# Patient Record
Sex: Male | Born: 1940
Health system: Southern US, Community
[De-identification: ages and names within clinical notes are randomized; demographics above are authoritative.]

## PROBLEM LIST (undated history)

## (undated) DIAGNOSIS — G4730 Sleep apnea, unspecified: Secondary | ICD-10-CM

## (undated) DIAGNOSIS — R001 Bradycardia, unspecified: Secondary | ICD-10-CM

## (undated) DIAGNOSIS — I10 Essential (primary) hypertension: Secondary | ICD-10-CM

## (undated) DIAGNOSIS — C801 Malignant (primary) neoplasm, unspecified: Secondary | ICD-10-CM

## (undated) DIAGNOSIS — I517 Cardiomegaly: Secondary | ICD-10-CM

## (undated) DIAGNOSIS — I480 Paroxysmal atrial fibrillation: Secondary | ICD-10-CM

## (undated) DIAGNOSIS — I499 Cardiac arrhythmia, unspecified: Secondary | ICD-10-CM

## (undated) DIAGNOSIS — I495 Sick sinus syndrome: Secondary | ICD-10-CM

## (undated) DIAGNOSIS — R55 Syncope and collapse: Secondary | ICD-10-CM

## (undated) HISTORY — PX: COLONOSCOPY: SHX174

## (undated) HISTORY — PX: VASECTOMY: SHX75

## (undated) HISTORY — PX: TONSILLECTOMY: SUR1361

## (undated) HISTORY — DX: Essential (primary) hypertension: I10

## (undated) HISTORY — PX: FOOT SURGERY: SHX648

## (undated) HISTORY — PX: ADENOIDECTOMY: SUR15

## (undated) HISTORY — DX: Sleep apnea, unspecified: G47.30

## (undated) HISTORY — DX: Syncope and collapse: R55

## (undated) HISTORY — DX: Bradycardia, unspecified: R00.1

## (undated) HISTORY — DX: Paroxysmal atrial fibrillation: I48.0

## (undated) HISTORY — PX: COLONOSCOPY W/ POLYPECTOMY: SHX1380

## (undated) HISTORY — DX: Cardiomegaly: I51.7

---
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
## 2000-12-30 ENCOUNTER — Encounter (INDEPENDENT_AMBULATORY_CARE_PROVIDER_SITE_OTHER): Payer: Self-pay | Admitting: *Deleted

## 2000-12-30 ENCOUNTER — Ambulatory Visit (HOSPITAL_COMMUNITY): Admission: RE | Admit: 2000-12-30 | Discharge: 2000-12-30 | Payer: Self-pay | Admitting: *Deleted

## 2003-02-22 ENCOUNTER — Emergency Department (HOSPITAL_COMMUNITY): Admission: EM | Admit: 2003-02-22 | Discharge: 2003-02-22 | Payer: Self-pay | Admitting: Emergency Medicine

## 2004-01-18 ENCOUNTER — Ambulatory Visit (HOSPITAL_COMMUNITY): Admission: RE | Admit: 2004-01-18 | Discharge: 2004-01-18 | Payer: Self-pay | Admitting: *Deleted

## 2004-01-18 ENCOUNTER — Encounter (INDEPENDENT_AMBULATORY_CARE_PROVIDER_SITE_OTHER): Payer: Self-pay | Admitting: Specialist

## 2006-03-24 ENCOUNTER — Ambulatory Visit (HOSPITAL_COMMUNITY): Admission: RE | Admit: 2006-03-24 | Discharge: 2006-03-24 | Payer: Self-pay | Admitting: *Deleted

## 2006-03-24 ENCOUNTER — Encounter (INDEPENDENT_AMBULATORY_CARE_PROVIDER_SITE_OTHER): Payer: Self-pay | Admitting: *Deleted

## 2006-11-12 ENCOUNTER — Encounter: Admission: RE | Admit: 2006-11-12 | Discharge: 2006-11-12 | Payer: Self-pay | Admitting: Internal Medicine

## 2007-11-15 ENCOUNTER — Emergency Department (HOSPITAL_COMMUNITY): Admission: EM | Admit: 2007-11-15 | Discharge: 2007-11-15 | Payer: Self-pay | Admitting: Emergency Medicine

## 2008-04-06 ENCOUNTER — Ambulatory Visit (HOSPITAL_COMMUNITY): Admission: RE | Admit: 2008-04-06 | Discharge: 2008-04-06 | Payer: Self-pay | Admitting: *Deleted

## 2008-04-06 ENCOUNTER — Encounter (INDEPENDENT_AMBULATORY_CARE_PROVIDER_SITE_OTHER): Payer: Self-pay | Admitting: *Deleted

## 2008-09-28 IMAGING — CT CT CHEST LIMITED W/O CM
2 of 3 series · 15 of 28 positions shown, 18 images · IV contrast (agent unspecified)
Comparison: none

CLINICAL DATA: Swelling of left sternoclavicular joint for a year.  No known trauma.
 CHEST CT WITHOUT CONTRAST:
TECHNIQUE: Multidetector CT imaging of the chest was performed following the standard protocol without IV contrast.

[Series 300: coronal · coronal · 0.76mm/px · 4 of 40 slices shown]
[im 8/40  lung]
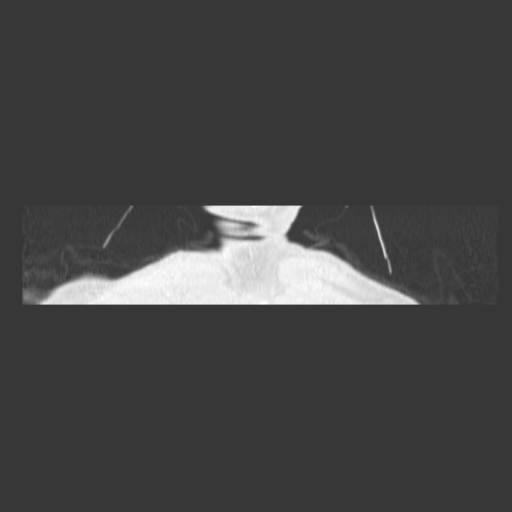
[im 16/40  lung]
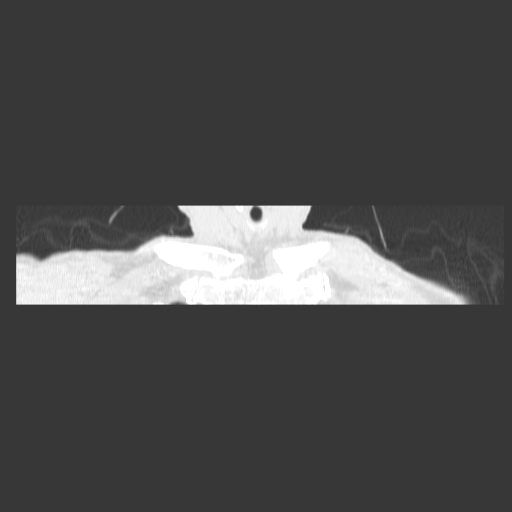
[im 24/40  lung]
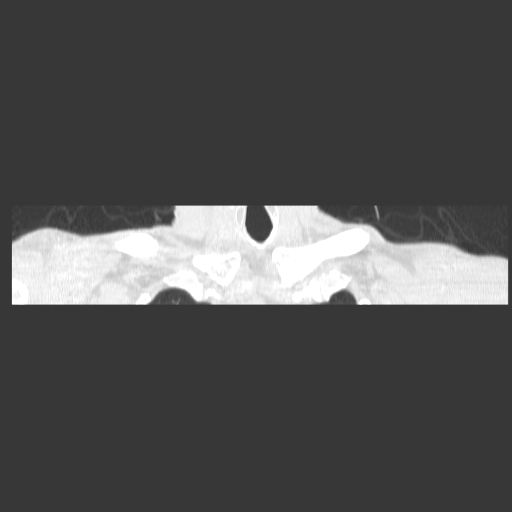
[im 32/40  lung]
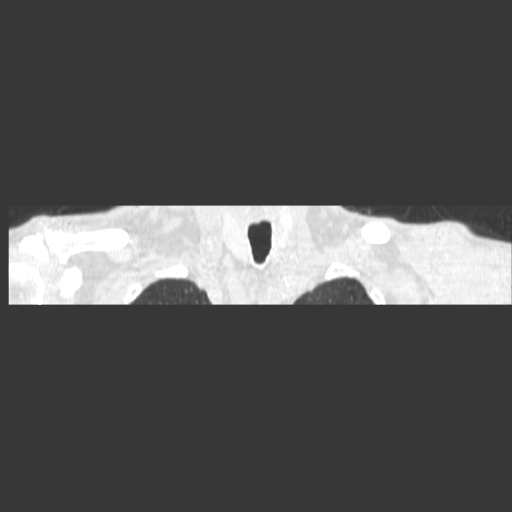

[Series 301: sagittal · sagittal · 0.76mm/px · 11 of 94 slices shown, 14 images]
[im 8/94  mediastinal]
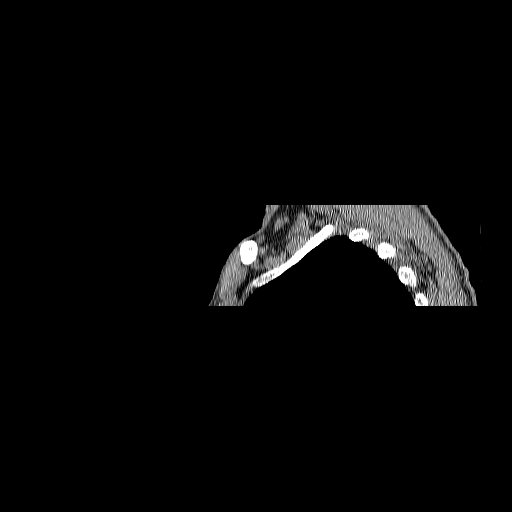
[im 8/94  lung]
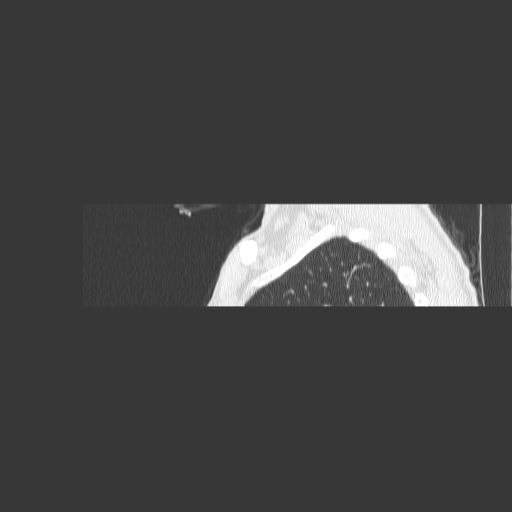
[im 16/94  lung]
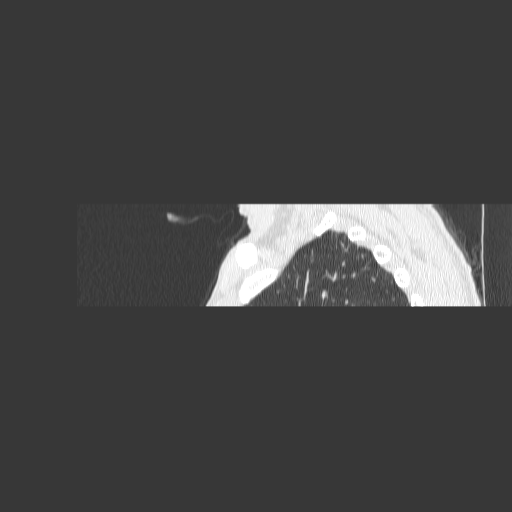
[im 24/94  lung]
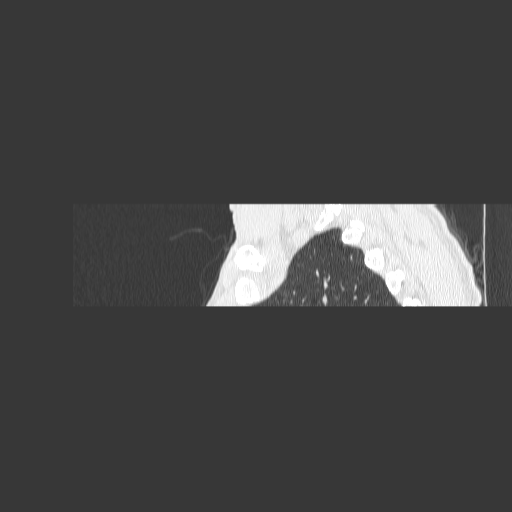
[im 32/94  lung]
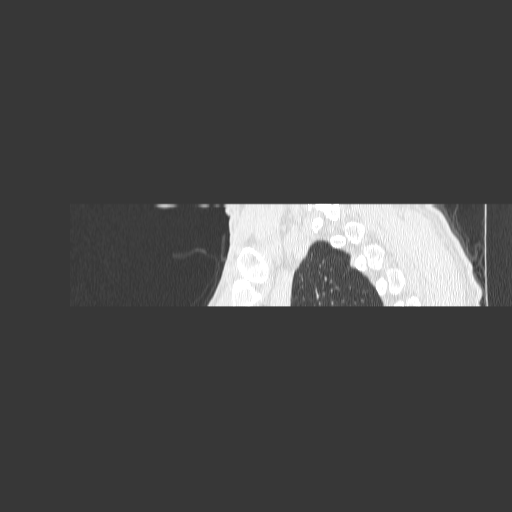
[im 39/94  mediastinal]
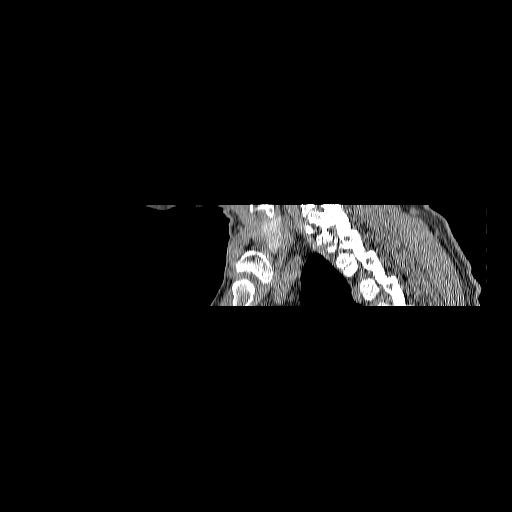
[im 39/94  lung]
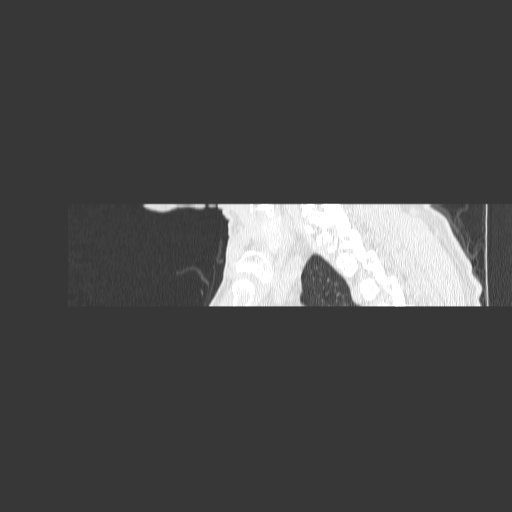
[im 47/94  lung]
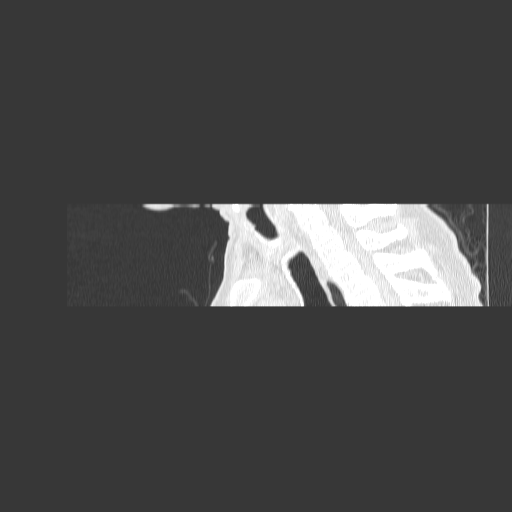
[im 55/94  lung]
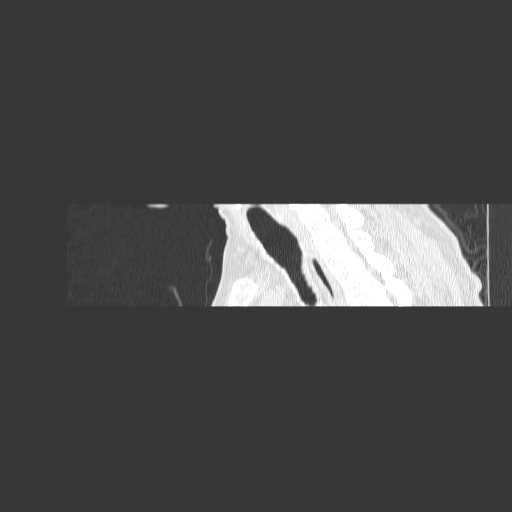
[im 63/94  lung]
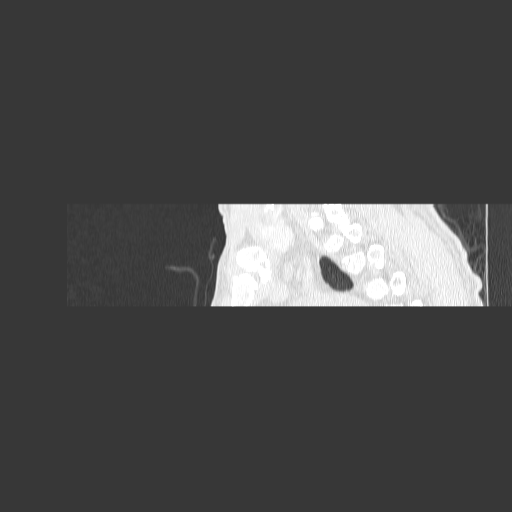
[im 70/94  mediastinal]
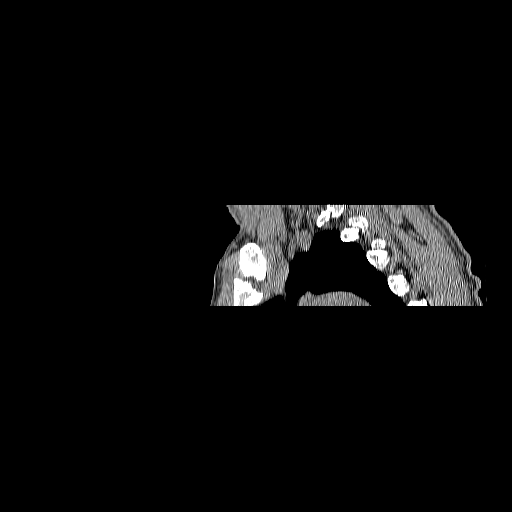
[im 70/94  lung]
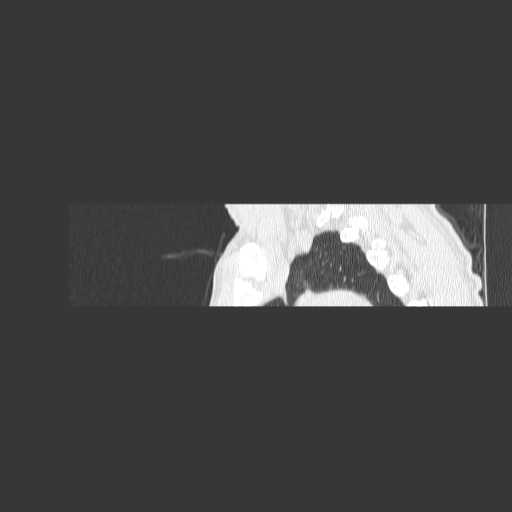
[im 78/94  lung]
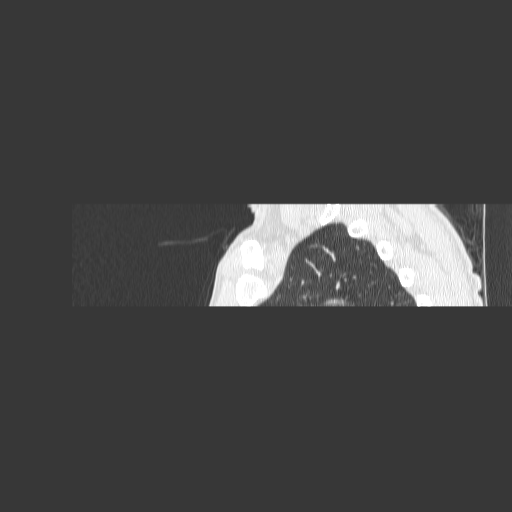
[im 86/94  lung]
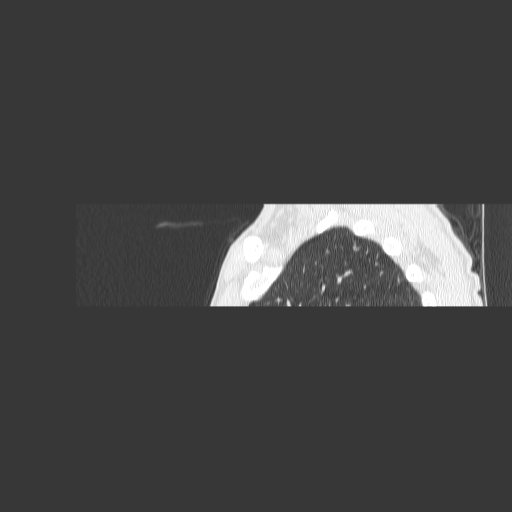

[15 of 28 positions shown; findings below may reference images not displayed]

FINDINGS: There is ordinary appearance osteoarthritic change of the left sternoclavicular joint with sclerosis, cystic change and hypertrophic change.  There is no sign of a destructive process to suggest infection or neoplasm.  There are no specific findings to suggest any other type of arthropathy.  Mild degenerative changes are also evident at the acromioclavicular joints.   The lung apices are clear.  The upper mediastinum is unremarkable.   The soft tissues of the lower neck are unremarkable except for a 5 mm low density area in the left lobe of the thyroid likely to represent an insignificant cyst.
IMPRESSION: Hypertrophic osteoarthritic change of the left sternoclavicular joint.  See above for full discussion.

## 2009-10-01 IMAGING — CR DG CHEST 2V
2 series · 2 of 2 positions shown · non-contrast
Comparison: None

CLINICAL DATA: Shortness of breath.  Chest pain.

CHEST - 2 VIEW

[w chest pa]
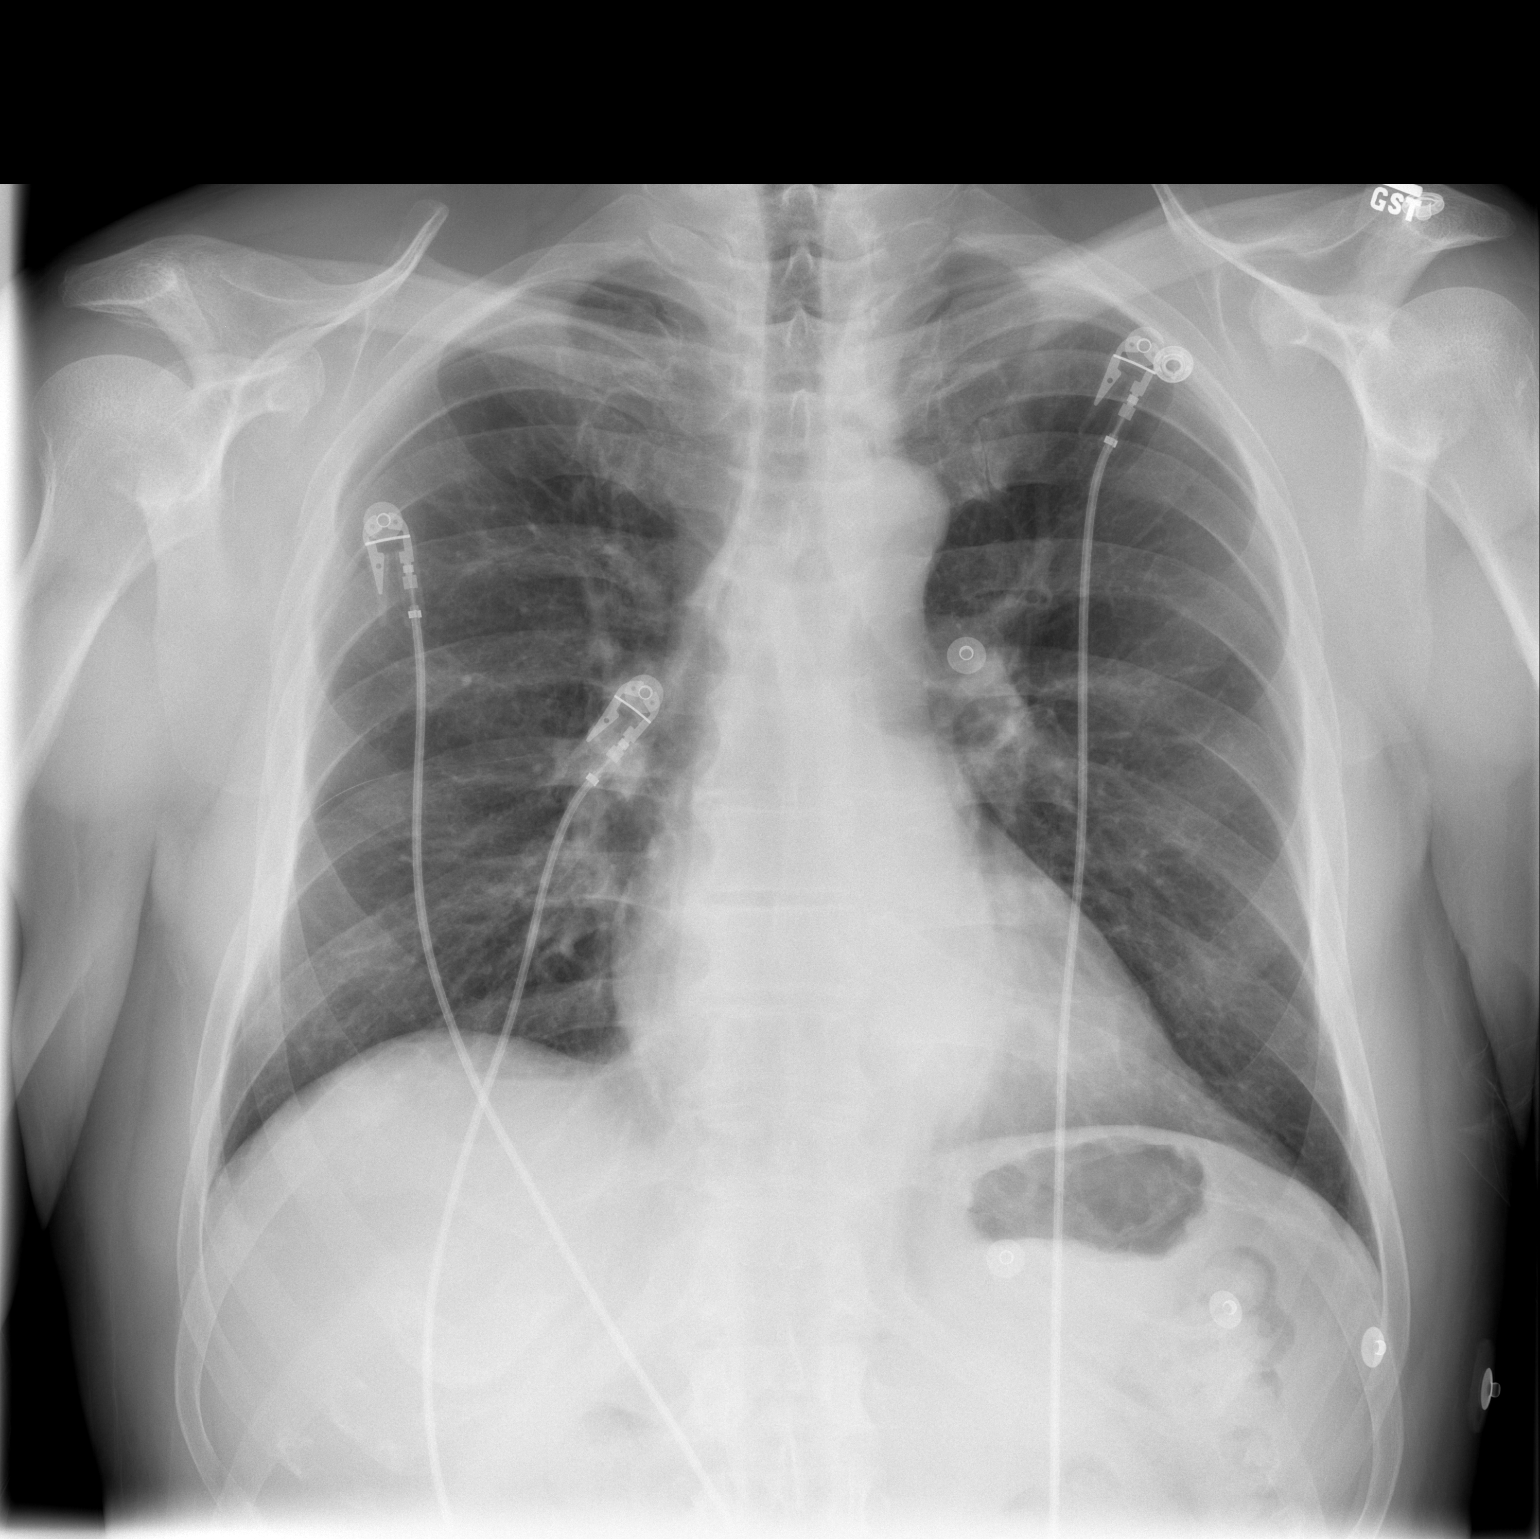

[w chest lat]
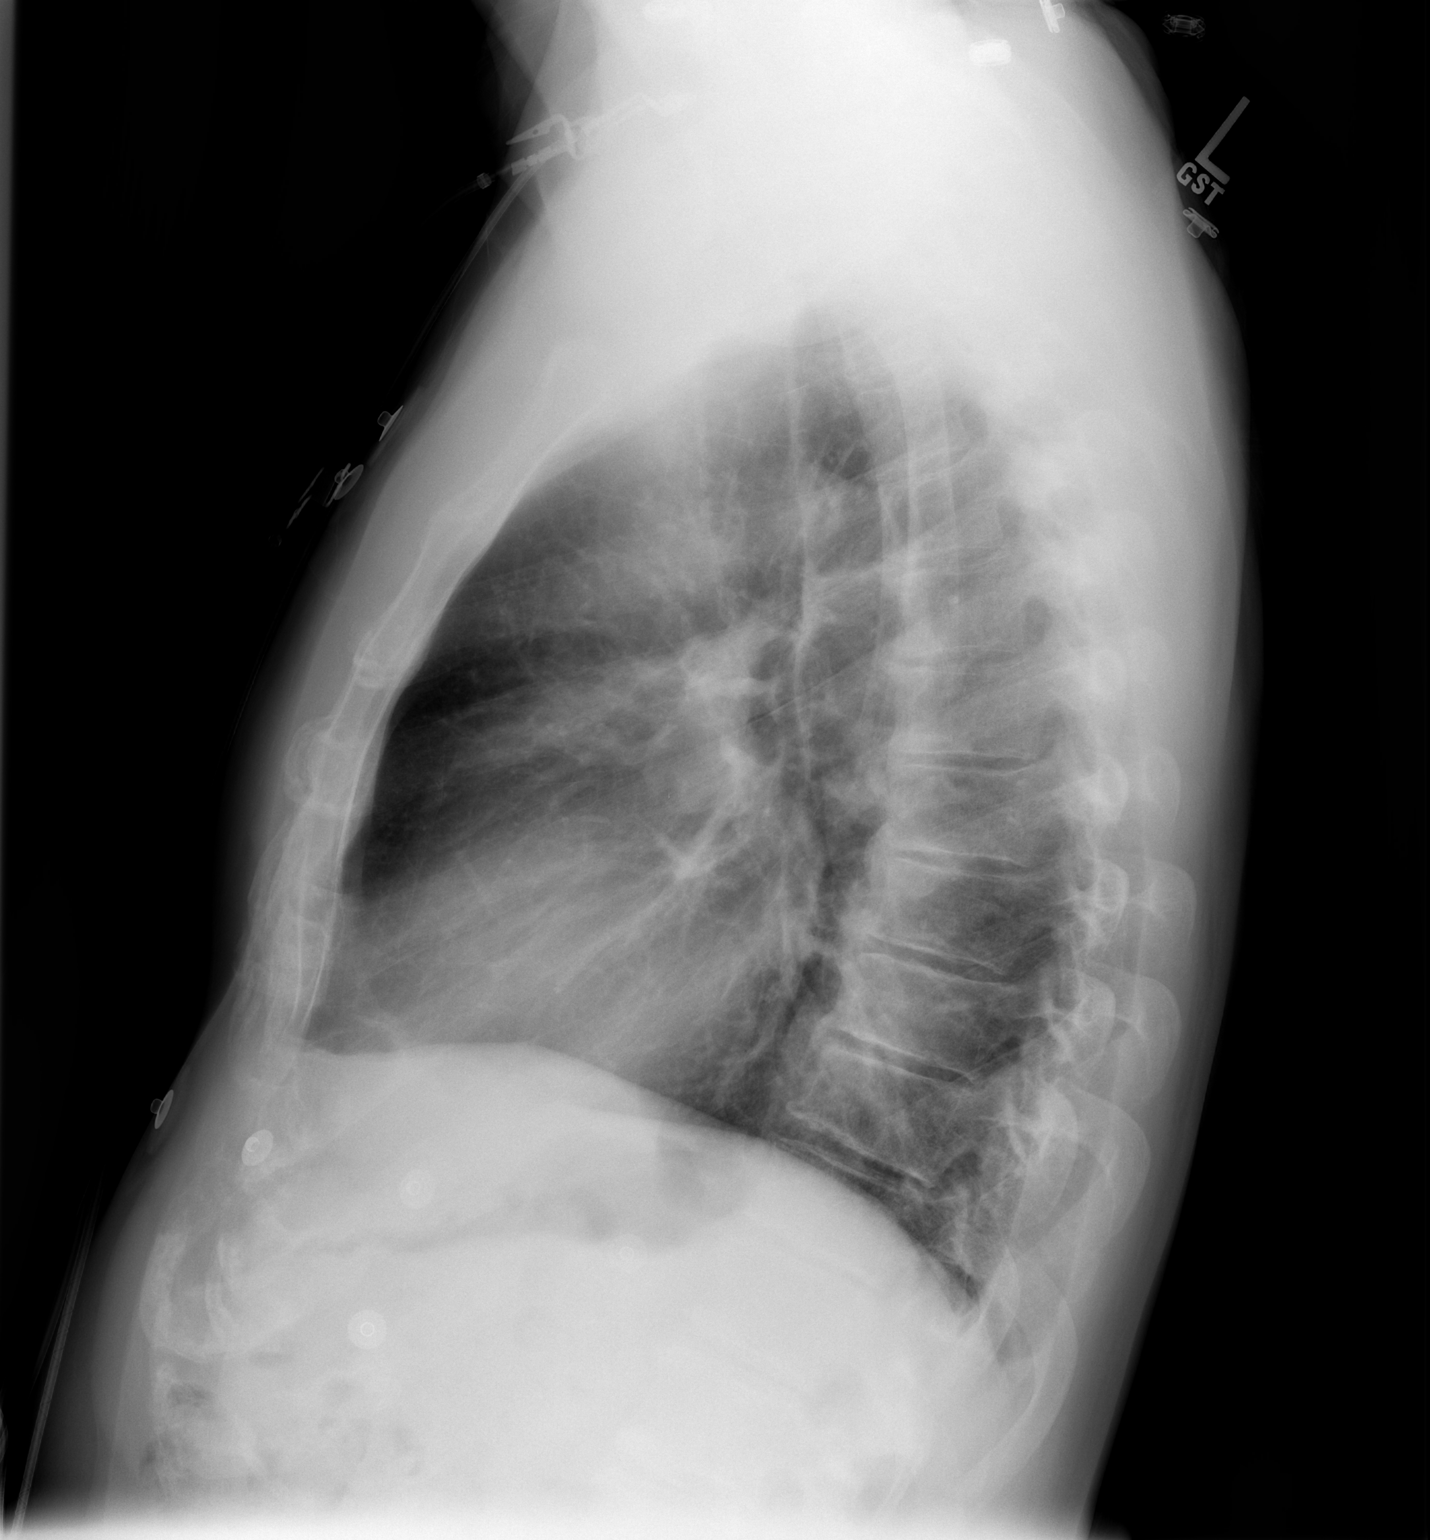

[2 of 2 positions shown; findings below may reference images not displayed]

FINDINGS: Mid thoracic spondylosis. Midline trachea. Normal heart
size and mediastinal contours. No pleural effusion or pneumothorax.
Clear lungs.
IMPRESSION: No acute cardiopulmonary disease.

## 2010-06-18 NOTE — Op Note (Signed)
NAMEPRINCE, COUEY NO.:  [id number]   MEDICAL RECORD NO.:  [id number]          PATIENT TYPE:  AMB   LOCATION:  ENDO                         FACILITY:  [facility]   PHYSICIAN:  Georgiana Spinner, M.D.    DATE OF BIRTH:  10-08-1940   DATE OF PROCEDURE:  04/06/2008  DATE OF DISCHARGE:                               OPERATIVE REPORT   PROCEDURE:  Colonoscopy.   INDICATIONS:  Colon polyps.   ANESTHESIA:  1. Fentanyl 100 mcg.  2. Versed 10 mg.   PROCEDURE:  With the patient mildly sedated in the left lateral  decubitus position, a rectal exam was performed which was unremarkable  to my exam.  Subsequently, the Pentax videoscopic pediatric colonoscope  was inserted in the rectum and passed under direct vision with pressure  applied to reach the cecum identified by base of cecum, ileocecal valve,  both of which were photographed.  From this point, the colonoscope was  slowly withdrawn, taking circumferential views of the colonic mucosa,  stopping in the hepatic flexure where a polyp was seen, photographed and  removed using hot biopsy forceps technique setting of 20/150 blended  current.  Tissue was retrieved for pathology.  The endoscope was then  withdrawn all the way to the rectum, stopping to photograph diverticula  seen along the way in the sigmoid colon.  The rectum appeared normal on  direct and showed hemorrhoids on retroflexed view.  The endoscope was  straightened and withdrawn.  The patient's vital signs, pulse oximeter  remained stable.  The patient tolerated procedure well without apparent  complication.   FINDINGS:  Polyp of hepatic flexure removed, diverticulosis moderately  severe with thickening of the sigmoid colon area of fair significance  and internal hemorrhoids.   PLAN:  Await biopsy report.  The patient will call me for results and  follow up with me as an outpatient.           ______________________________  Georgiana Spinner, M.D.     GMO/MEDQ  D:  04/06/2008  T:  04/06/2008  Job:  [id number]

## 2010-06-21 NOTE — Op Note (Signed)
[name], Matthew Cannon NO.:  [id number]   MEDICAL RECORD NO.:  [id number]          PATIENT TYPE:  AMB   LOCATION:  ENDO                         FACILITY:  [facility]   PHYSICIAN:  Georgiana Spinner, M.D.    DATE OF BIRTH:  February 10, 1940   DATE OF PROCEDURE:  01/18/2004  DATE OF DISCHARGE:                                 OPERATIVE REPORT   PROCEDURE:  Colonoscopy with biopsy and polypectomy.   INDICATIONS FOR PROCEDURE:  Colon polyp.   ANESTHESIA:  Demerol 60, Versed 7 mg.   DESCRIPTION OF PROCEDURE:  With the patient mildly sedated in the left  lateral decubitus position, the Olympus videoscopic colonoscope was inserted  in the rectum after a normal rectal exam and passed under direct vision to  the cecum identified by the ileocecal valve and appendiceal orifice.  From  this point, the colonoscope was slowly withdrawn taking circumferential  views of the colonic mucosa stopping only to photograph diverticula seen  along the way as we reached the descending colon where a small polyp was  seen and removed using hot biopsy forceps technique on a setting of 20:20  blended current.  We next stopped in the sigmoid colon where a second polyp  was seen and it too was removed.  This time using snare cautery technique  again the same setting of 20/20 blended current,  we withdrew the  colonoscope all the way to the rectum which appeared normal in direct and  retroflexed view.  The endoscope was straightened and withdrawn. The  patient's vital signs and pulse oximeter remained stable. The patient  tolerated the procedure well without apparent complications.   FINDINGS:  Diverticulosis of the sigmoid colon and somewhat of descending  colon as well.  Polyps of descending colon near the splenic flexure and of  the sigmoid colon in the rectosigmoid area.   PLAN:  Await biopsy report. The patient will call me for results and  followup with me as an outpatient.      GMO/MEDQ  D:   01/18/2004  T:  01/18/2004  Job:  [id number]

## 2010-06-21 NOTE — Op Note (Signed)
NAMEJACINTO, Matthew Cannon NO.:  [id number]   MEDICAL RECORD NO.:  [id number]          PATIENT TYPE:  AMB   LOCATION:  ENDO                         FACILITY:  [facility]   PHYSICIAN:  Georgiana Spinner, M.D.    DATE OF BIRTH:  1940-12-08   DATE OF PROCEDURE:  03/24/2006  DATE OF DISCHARGE:                               OPERATIVE REPORT   PROCEDURE:  Colonoscopy.   INDICATIONS:  Colon polyps.   ANESTHESIA:  Fentanyl 125 mcg, Versed 12 mg.   PROCEDURE:  With the patient mildly sedated in the left lateral  decubitus position, a rectal examination was performed and was normal to  my limited exam.  Subsequently the Pentax videoscopic colonoscope was  inserted in the rectum and passed under direct vision to the sigmoid  colon and I could pass it no further due to tortuosity so I removed this  colonoscope and subsequently placed a Pentax videoscopic pediatric  colonoscope into the rectum and passed it under direct vision through a  very tortuous diverticula filled sigmoid colon.  With pressure applied,  we were able to reach the cecum and cecum identified by ileocecal valve  and appendiceal orifice, both which were photographed. From this point  the colonoscope was slowly withdrawn taking circumferential views of  colonic mucosa stopping in the ascending colon where a polyp was seen  and removed using hot biopsy forceps technique, setting of 20/200  blended current.  We next stopped only in the rectum which appeared  normal on direct and showed hemorrhoids on retroflexed view.  The  endoscope was straightened and withdrawn.  The patient's vital signs,  pulse oximeter remained stable.  The patient tolerated procedure well  without apparent complications.   FINDINGS:  Diverticular filled sigmoid colon.  Internal hemorrhoids,  polyp of ascending colon.  Await biopsy report.  The patient will call  me for results and follow-up with me as an outpatient.     ______________________________  Georgiana Spinner, M.D.     GMO/MEDQ  D:  03/24/2006  T:  03/24/2006  Job:  [id number]

## 2010-06-21 NOTE — Procedures (Signed)
[facility]  Patient:    Matthew Cannon, Matthew Cannon Visit Number: [id number] MRN: [id number]          Service Type: END Location: ENDO Attending Physician:  Sabino Gasser Dictated by:   Sabino Gasser, M.D. Proc. Date: 12/30/00 Admit Date:  12/30/2000                             Procedure Report  PROCEDURE:  Colonoscopy.  INDICATION FOR PROCEDURE:  Rectal bleeding.  ANESTHESIA:  Demerol 100, Versed 10 mg.  DESCRIPTION OF PROCEDURE:  With the patient mildly sedated in the left lateral decubitus position, the Olympus videoscopic pediatric colonoscope was inserted in the rectum and passed under direct vision to the cecum identified by the ileocecal valve and appendiceal orifice both of which were photographed. From this point, the colonoscope was slowly withdrawn taking circumferential views of the entire colonic mucosa, stopping at 40 cm from the anal verge at which a small polyp was seen, photographed, and removed using hot biopsy forceps technique on a setting of 20:20 blended current. There was moderate diverticulosis seen in the sigmoid colon as well until we reached the rectum which appeared normal in direct and retroflexed view. The endoscope was straightened and withdrawn. The patients vital signs and pulse oximeter remained stable. The patient tolerated the procedure well without apparent complications.  FINDINGS:  Diverticulosis of the sigmoid colon, polyp 40 cm removed. Await biopsy report. The patient will call me for results and follow-up with me as an outpatient. Dictated by:   Sabino Gasser, M.D. Attending Physician:  Sabino Gasser DD:  12/30/00 TD:  12/30/00 Job: [id number]

## 2010-11-04 LAB — BASIC METABOLIC PANEL
BUN: 18
CO2: 27
Chloride: 106
Creatinine, Ser: 0.99
Glucose, Bld: 105 — ABNORMAL HIGH

## 2010-11-04 LAB — D-DIMER, QUANTITATIVE: D-Dimer, Quant: 0.66 — ABNORMAL HIGH

## 2012-06-11 ENCOUNTER — Emergency Department (HOSPITAL_COMMUNITY)
Admission: EM | Admit: 2012-06-11 | Discharge: 2012-06-11 | Disposition: A | Discharge status: Home / Self Care | Payer: Medicare Other | Attending: Emergency Medicine | Admitting: Emergency Medicine

## 2012-06-11 ENCOUNTER — Encounter (HOSPITAL_COMMUNITY): Payer: Self-pay | Admitting: Emergency Medicine

## 2012-06-11 DIAGNOSIS — T148XXA Other injury of unspecified body region, initial encounter: Secondary | ICD-10-CM

## 2012-06-11 DIAGNOSIS — Y929 Unspecified place or not applicable: Secondary | ICD-10-CM | POA: Insufficient documentation

## 2012-06-11 DIAGNOSIS — Z23 Encounter for immunization: Secondary | ICD-10-CM | POA: Insufficient documentation

## 2012-06-11 DIAGNOSIS — Y9389 Activity, other specified: Secondary | ICD-10-CM | POA: Insufficient documentation

## 2012-06-11 DIAGNOSIS — S60459A Superficial foreign body of unspecified finger, initial encounter: Secondary | ICD-10-CM | POA: Insufficient documentation

## 2012-06-11 DIAGNOSIS — W268XXA Contact with other sharp object(s), not elsewhere classified, initial encounter: Secondary | ICD-10-CM | POA: Insufficient documentation

## 2012-06-11 DIAGNOSIS — Z79899 Other long term (current) drug therapy: Secondary | ICD-10-CM | POA: Insufficient documentation

## 2012-06-11 MED ORDER — TETANUS-DIPHTH-ACELL PERTUSSIS 5-2.5-18.5 LF-MCG/0.5 IM SUSP
0.5000 mL | Freq: Once | INTRAMUSCULAR | Status: AC
  Administered 2012-06-11: 0.5 mL via INTRAMUSCULAR
  Filled 2012-06-11: qty 0.5

## 2012-06-11 MED ORDER — CEPHALEXIN 500 MG PO CAPS: 500.0000 mg | ORAL_CAPSULE | Freq: Four times a day (QID) | ORAL | Status: DC

## 2012-06-11 NOTE — ED Notes (Signed)
Discharge instructions reviewed. Pt verbalized understanding.  

## 2012-06-11 NOTE — ED Provider Notes (Signed)
History     CSN: [id number]  Arrival date & time 06/11/12  2440   First MD Initiated Contact with Patient 06/11/12 1017      Chief Complaint  Patient presents with  . Foreign Body in Skin    (Consider location/radiation/quality/duration/timing/severity/associated sxs/prior treatment) HPI Comments: Patient is a 72 year old male who presents today after sawing a piece of wood and getting a splinter in his left thumb. He states he knows it is a large piece of wood that goes all the way from the distal fingernail through the skin on the palmar side and comes out approximately 3 cm proximally. He states he is in mild pain. The pain does not radiate. No fever, chills, nausea, vomiting, abdominal pain, numbness, weakness. He does not know the date of his last tetanus shot.   The history is provided by the patient. No language interpreter was used.    History reviewed. No pertinent past medical history.  History reviewed. No pertinent past surgical history.  History reviewed. No pertinent family history.  History  Substance Use Topics  . Smoking status: Never Smoker   . Smokeless tobacco: Not on file  . Alcohol Use: Yes      Review of Systems  Constitutional: Negative for fever and chills.  Respiratory: Negative for shortness of breath.   Gastrointestinal: Negative for nausea and vomiting.  Skin: Positive for wound. Negative for rash.  All other systems reviewed and are negative.    Allergies  Zithromax  Home Medications   Current Outpatient Rx  Name  Route  Sig  Dispense  Refill  . lisinopril (PRINIVIL,ZESTRIL) 10 MG tablet   Oral   Take 10 mg by mouth every evening.           BP 153/88  Pulse 54  Temp(Src) 97.5 F (36.4 C) (Oral)  Resp 18  SpO2 95%  Physical Exam  Nursing note and vitals reviewed. Constitutional: He is oriented to person, place, and time. He appears well-developed and well-nourished. No distress.  HENT:  Head: Normocephalic and  atraumatic.  Right Ear: External ear normal.  Left Ear: External ear normal.  Nose: Nose normal.  Eyes: Conjunctivae are normal.  Neck: Normal range of motion. No tracheal deviation present.  Cardiovascular: Normal rate, regular rhythm and normal heart sounds.   Pulmonary/Chest: Effort normal and breath sounds normal. No stridor.  Abdominal: Soft. He exhibits no distension. There is no tenderness.  Musculoskeletal: Normal range of motion.  Neurological: He is alert and oriented to person, place, and time.  Skin: Skin is warm and dry. He is not diaphoretic.  Wood splinter visible on palmar aspect of left thumb, with splinter visible on distal end of the fingernail on left thumb No erythema, swelling Some dried blood Flexion and extension intact in left thumb; neurovascularly intact  Psychiatric: He has a normal mood and affect. His behavior is normal.    ED Course  FOREIGN BODY REMOVAL Date/Time: 06/11/2012 11:17 AM Performed by: Mora Bellman Authorized by: Mora Bellman Consent: Verbal consent obtained. written consent not obtained. The procedure was performed in an emergent situation. Risks and benefits: risks, benefits and alternatives were discussed Consent given by: patient Patient understanding: patient states understanding of the procedure being performed Required items: required blood products, implants, devices, and special equipment available Patient identity confirmed: verbally with patient and arm band Time out: Immediately prior to procedure a "time out" was called to verify the correct patient, procedure, equipment, support staff and site/side marked  as required. Body area: skin General location: upper extremity Location details: left thumb Anesthesia: digital block Local anesthetic: lidocaine 2% without epinephrine Anesthetic total: 3 ml Patient sedated: no Patient restrained: no Patient cooperative: yes Localization method: visualized Removal mechanism:  hemostat Dressing: dressing applied Tendon involvement: none Depth: subcutaneous Complexity: simple 2 objects recovered. Objects recovered: wood splinters Post-procedure assessment: foreign body removed Patient tolerance: Patient tolerated the procedure well with no immediate complications.   (including critical care time)  Labs Reviewed - No data to display No results found.   1. Splinter       MDM  Patient presents today with a large splinter in his left thumb. The splinter was successfully removed after a digital block. He was given prophylactic antibiotics. No signs of infection. No signs of tendon or bone involvement. TDaP given. Return instruction given. Vital signs stable for discharge.Patient / Family / Caregiver informed of clinical course, understand medical decision-making process, and agree with plan.         Mora Bellman, PA-C 06/11/12 1625

## 2012-06-11 NOTE — ED Provider Notes (Signed)
Medical screening examination/treatment/procedure(s) were performed by non-physician practitioner and as supervising physician I was immediately available for consultation/collaboration.  Martha K Linker, MD 06/11/12 1634 

## 2012-06-11 NOTE — ED Notes (Signed)
Pt here with splinter in left thumb after sawing wood; bleeding controlled; small piece of wood sticking out of skin

## 2012-09-30 ENCOUNTER — Ambulatory Visit (INDEPENDENT_AMBULATORY_CARE_PROVIDER_SITE_OTHER): Payer: Self-pay | Admitting: General Surgery

## 2012-09-30 ENCOUNTER — Encounter (INDEPENDENT_AMBULATORY_CARE_PROVIDER_SITE_OTHER): Payer: Self-pay | Admitting: General Surgery

## 2012-10-06 ENCOUNTER — Ambulatory Visit (INDEPENDENT_AMBULATORY_CARE_PROVIDER_SITE_OTHER): Payer: Self-pay | Admitting: General Surgery

## 2012-10-07 ENCOUNTER — Encounter (INDEPENDENT_AMBULATORY_CARE_PROVIDER_SITE_OTHER): Payer: Self-pay | Admitting: General Surgery

## 2012-10-07 ENCOUNTER — Ambulatory Visit (INDEPENDENT_AMBULATORY_CARE_PROVIDER_SITE_OTHER): Payer: Medicare Other | Admitting: General Surgery

## 2012-10-07 VITALS — BP 124/68 | HR 68 | Temp 98.4°F | Resp 14 | Ht 70.0 in | Wt 191.2 lb

## 2012-10-07 DIAGNOSIS — K4090 Unilateral inguinal hernia, without obstruction or gangrene, not specified as recurrent: Secondary | ICD-10-CM

## 2012-10-07 MED ORDER — TAMSULOSIN HCL 0.4 MG PO CAPS: 0.4000 mg | ORAL_CAPSULE | Freq: Every day | ORAL | Status: DC

## 2012-10-07 NOTE — Patient Instructions (Signed)
Start taking your Flomax 3 days before surgery to help prevent bladder from going to sleep after surgery  Hernia Repair with Laparoscope A hernia occurs when an internal organ pushes out through a weak spot in the belly (abdominal) wall muscles. Hernias most commonly occur in the groin and around the navel. Hernias can also occur through a cut by the surgeon (incision) after an abdominal operation. A hernia may be caused by:  Lifting heavy objects.  Prolonged coughing.  Straining to move your bowels. Hernias can often be pushed back into place (reduced). Most hernias tend to get worse over time. Problems occur when abdominal contents get stuck in the opening and the blood supply is blocked or impaired (incarcerated hernia). Because of these risks, you require surgery to repair the hernia. Your hernia will be repaired using a laparoscope. Laparoscopic surgery is a type of minimally invasive surgery. It does not involve making a typical surgical cut (incision) in the skin. A laparoscope is a telescope-like rod and lens system. It is usually connected to a video camera and a light source so your caregiver can clearly see the operative area. The instruments are inserted through  to  inch (5 mm or 10 mm) openings in the skin at specific locations. A working and viewing space is created by blowing a small amount of carbon dioxide gas into the abdominal cavity. The abdomen is essentially blown up like a balloon (insufflated). This elevates the abdominal wall above the internal organs like a dome. The carbon dioxide gas is common to the human body and can be absorbed by tissue and removed by the respiratory system. Once the repair is completed, the small incisions will be closed with either stitches (sutures) or staples (just like a paper stapler only this staple holds the skin together). LET YOUR CAREGIVERS KNOW ABOUT:  Allergies.  Medications taken including herbs, eye drops, over the counter  medications, and creams.  Use of steroids (by mouth or creams).  Previous problems with anesthetics or Novocaine.  Possibility of pregnancy, if this applies.  History of blood clots (thrombophlebitis).  History of bleeding or blood problems.  Previous surgery.  Other health problems. BEFORE THE PROCEDURE  Laparoscopy can be done either in a hospital or out-patient clinic. You may be given a mild sedative to help you relax before the procedure. Once in the operating room, you will be given a general anesthesia to make you sleep (unless you and your caregiver choose a different anesthetic).  AFTER THE PROCEDURE  After the procedure you will be watched in a recovery area. Depending on what type of hernia was repaired, you might be admitted to the hospital or you might go home the same day. With this procedure you may have less pain and scarring. This usually results in a quicker recovery and less risk of infection. HOME CARE INSTRUCTIONS   Bed rest is not required. You may continue your normal activities but avoid heavy lifting (more than 10 pounds) or straining.  Cough gently. If you are a smoker it is best to stop, as even the best hernia repair can break down with the continual strain of coughing.  Avoid driving until given the OK by your surgeon.  There are no dietary restrictions unless given otherwise.  TAKE ALL MEDICATIONS AS DIRECTED.  Only take over-the-counter or prescription medicines for pain, discomfort, or fever as directed by your caregiver. SEEK MEDICAL CARE IF:   There is increasing abdominal pain or pain in your incisions.  There is more bleeding from incisions, other than minimal spotting.  You feel light headed or faint.  You develop an unexplained fever, chills, and/or an oral temperature above 102 F (38.9 C).  You have redness, swelling, or increasing pain in the wound.  Pus coming from wound.  A foul smell coming from the wound or dressings. SEEK  IMMEDIATE MEDICAL CARE IF:   You develop a rash.  You have difficulty breathing.  You have any allergic problems. MAKE SURE YOU:   Understand these instructions.  Will watch your condition.  Will get help right away if you are not doing well or get worse. Document Released: 01/20/2005 Document Revised: 04/14/2011 Document Reviewed: 12/20/2008 [facility] Patient Information 2014 [place], [place].

## 2012-10-07 NOTE — Progress Notes (Signed)
Patient ID: Matthew Cannon, male   DOB: 08/01/1940, 72 y.o.   MRN: [id number]  Chief Complaint  Patient presents with  . New Evaluation    eval LIH    HPI Matthew Cannon is a 72 y.o. male.   HPI 72 year old Caucasian male referred by Dr. Selena Batten for evaluation of a left inguinal hernia. The patient states that he noticed it about a year ago however over the past several months it has slowly gotten larger. It doesn't really cause him any discomfort. He thinks it looks abnormal. He denies any nausea, vomiting, diarrhea or constipation. He denies any weight change. He has had a vasectomy. He has never had any prior abdominal surgery  Past Medical History  Diagnosis Date  . Hypertension     Past Surgical History  Procedure Laterality Date  . Foot surgery      had bone spur  . Colonoscopy    . Vasectomy      Family History  Problem Relation Age of Onset  . Cancer Daughter     breast    Social History History  Substance Use Topics  . Smoking status: Former Smoker    Quit date: 03/06/1981  . Smokeless tobacco: Never Used  . Alcohol Use: Yes     Comment: 4 x week    Allergies  Allergen Reactions  . Zithromax [Azithromycin]     Current Outpatient Prescriptions  Medication Sig Dispense Refill  . Cinnamon 500 MG capsule Take 500 mg by mouth daily.      [name] Kitchen lisinopril (PRINIVIL,ZESTRIL) 10 MG tablet Take 10 mg by mouth every evening.      . tamsulosin (FLOMAX) 0.4 MG CAPS capsule Take 1 capsule (0.4 mg total) by mouth daily. Start taking 3 days before surgery  7 capsule  0   No current facility-administered medications for this visit.    Review of Systems Review of Systems  Constitutional: Negative for fever, chills, appetite change and unexpected weight change.  HENT: Negative for congestion and trouble swallowing.   Eyes: Negative for visual disturbance.  Respiratory: Negative for chest tightness and shortness of breath.   Cardiovascular: Negative for chest pain  and leg swelling.       No PND, no orthopnea, no DOE  Gastrointestinal:       See HPI  Genitourinary: Negative for dysuria and hematuria.       Positive for nocturia and frequency  Musculoskeletal: Negative.   Skin: Negative for rash.  Neurological: Negative for seizures and speech difficulty.  Hematological: Does not bruise/bleed easily.  Psychiatric/Behavioral: Negative for behavioral problems and confusion.    Blood pressure 124/68, pulse 68, temperature 98.4 F (36.9 C), temperature source Temporal, resp. rate 14, height 5\' 10"  (1.778 m), weight 191 lb 3.2 oz (86.728 kg).  Physical Exam Physical Exam  Constitutional: He is oriented to person, place, and time. He appears well-developed and well-nourished. No distress.  HENT:  Head: Normocephalic and atraumatic.  Right Ear: External ear normal.  Left Ear: External ear normal.  Eyes: Conjunctivae are normal. No scleral icterus.  Neck: Normal range of motion. Neck supple. No tracheal deviation present. No thyromegaly present.  Cardiovascular: Normal rate, normal heart sounds and intact distal pulses.   Pulmonary/Chest: Effort normal and breath sounds normal. No respiratory distress. He has no wheezes.  Abdominal: Soft. He exhibits no distension. There is no tenderness. There is no rebound and no guarding. A hernia is present. Hernia confirmed positive in the left inguinal area. Hernia  confirmed negative in the right inguinal area.  Genitourinary: Testes normal and penis normal.     Reducible left inguinal hernia  Musculoskeletal: Normal range of motion. He exhibits no edema and no tenderness.  Lymphadenopathy:    He has no cervical adenopathy.  Neurological: He is alert and oriented to person, place, and time. He exhibits normal muscle tone.  Skin: Skin is warm and dry. No rash noted. He is not diaphoretic. No erythema. No pallor.  Psychiatric: He has a normal mood and affect. His behavior is normal. Judgment and thought content  normal.    Data Reviewed Dr Elmyra Ricks note 09/24/12  Assessment    Left inguinal hernia     Plan    We discussed the etiology of inguinal hernias. We discussed the signs & symptoms of incarceration & strangulation.  We discussed non-operative and operative management.  The patient has elected To proceed with a laparoscopic repair of a left inguinal hernia with mesh   I described the procedure in detail.  The patient was given educational material. We discussed the risks and benefits including but not limited to bleeding, infection, chronic inguinal pain, nerve entrapment, hernia recurrence, mesh complications, hematoma formation, urinary retention, injury to the testicles, numbness in the groin, blood clots, injury to the surrounding structures, and anesthesia risk. We also discussed the typical post operative recovery course, including no heavy lifting for 4-6 weeks. I explained that the likelihood of improvement of their symptoms is Good. We also discussed the possibility of a contralateral repair if there is evidence during surgery of a right inguinal hernia.  He would like to wait to have surgery until after he returns from an international trip; I explained that should be completely fine. I will also place him on perioperative Flomax to decrease his risk of postoperative urinary retention  Mary Sella. Andrey Campanile, MD, FACS General, Bariatric, & Minimally Invasive Surgery [facility], [place]          [facility] M 10/07/2012, 3:09 PM

## 2012-12-01 NOTE — Progress Notes (Signed)
Need orders in EPIc.  Surgery scheduled for 12/15/12.  Preop on 12/10/12 at 0800am.  Thank You.

## 2012-12-03 ENCOUNTER — Telehealth (INDEPENDENT_AMBULATORY_CARE_PROVIDER_SITE_OTHER): Payer: Self-pay

## 2012-12-03 NOTE — Telephone Encounter (Signed)
Pt called in stating he was to be prescribed flomax to take perioperative. Looked through chart and it was sent electronically on 9/4 to [facility] on [address]. He will call them and see if they still have the prescription in their system. If not he will call back so we can re do the prescription for him.

## 2012-12-05 ENCOUNTER — Other Ambulatory Visit (INDEPENDENT_AMBULATORY_CARE_PROVIDER_SITE_OTHER): Payer: Self-pay | Admitting: General Surgery

## 2012-12-07 ENCOUNTER — Encounter (HOSPITAL_COMMUNITY): Payer: Self-pay | Admitting: Pharmacy Technician

## 2012-12-09 ENCOUNTER — Other Ambulatory Visit (HOSPITAL_COMMUNITY): Payer: Self-pay | Admitting: General Surgery

## 2012-12-09 NOTE — Patient Instructions (Addendum)
20 [name]  12/09/2012   Your procedure is scheduled on:  12/15/12  [facility]  Report to [facility] at    0630   AM.  Call this number if you have problems the morning of surgery: [phone]       Remember:   Do not eat food  Or drink :After Midnight. Tuesday NIGHT   Take these medicines the morning of surgery with A SIP OF WATER: NONE   .  Contacts, dentures or partial plates can not be worn to surgery  Leave suitcase in the car. After surgery it may be brought to your room.  For patients admitted to the hospital, checkout time is 11:00 AM day of  discharge.             SPECIAL INSTRUCTIONS- SEE [facility] PREPARING FOR SURGERY INSTRUCTION SHEET-     DO NOT WEAR JEWELRY, LOTIONS, POWDERS, OR PERFUMES.  WOMEN-- DO NOT SHAVE LEGS OR UNDERARMS FOR 12 HOURS BEFORE SHOWERS. MEN MAY SHAVE FACE.  Patients discharged the day of surgery will not be allowed to drive home. IF going home the day of surgery, you must have a driver and someone to stay with you for the first 24 hours  Name and phone number of your driver:       Matthew Cannon  wife                                                                                    [name]  PST [phone]                 FAILURE TO FOLLOW THESE INSTRUCTIONS MAY RESULT IN  CANCELLATION   OF YOUR SURGERY                                                  Patient Signature _____________________________

## 2012-12-10 ENCOUNTER — Encounter (HOSPITAL_COMMUNITY): Payer: Self-pay

## 2012-12-10 ENCOUNTER — Encounter (HOSPITAL_COMMUNITY)
Admission: RE | Admit: 2012-12-10 | Discharge: 2012-12-10 | Disposition: A | Discharge status: Home / Self Care | Payer: Medicare Other | Source: Ambulatory Visit | Attending: General Surgery | Admitting: General Surgery

## 2012-12-10 ENCOUNTER — Telehealth (INDEPENDENT_AMBULATORY_CARE_PROVIDER_SITE_OTHER): Payer: Self-pay | Admitting: General Surgery

## 2012-12-10 ENCOUNTER — Ambulatory Visit (HOSPITAL_COMMUNITY)
Admission: RE | Admit: 2012-12-10 | Discharge: 2012-12-10 | Disposition: A | Discharge status: Home / Self Care | Payer: Medicare Other | Source: Ambulatory Visit | Attending: General Surgery | Admitting: General Surgery

## 2012-12-10 DIAGNOSIS — Z01812 Encounter for preprocedural laboratory examination: Secondary | ICD-10-CM | POA: Insufficient documentation

## 2012-12-10 DIAGNOSIS — K4090 Unilateral inguinal hernia, without obstruction or gangrene, not specified as recurrent: Secondary | ICD-10-CM | POA: Insufficient documentation

## 2012-12-10 DIAGNOSIS — Z01810 Encounter for preprocedural cardiovascular examination: Secondary | ICD-10-CM | POA: Insufficient documentation

## 2012-12-10 DIAGNOSIS — Z01818 Encounter for other preprocedural examination: Secondary | ICD-10-CM | POA: Insufficient documentation

## 2012-12-10 LAB — CBC WITH DIFFERENTIAL/PLATELET
Basophils Absolute: 0.1 10*3/uL (ref 0.0–0.1)
Eosinophils Absolute: 0.3 10*3/uL (ref 0.0–0.7)
Eosinophils Relative: 4 % (ref 0–5)
MCH: 30.1 pg (ref 26.0–34.0)
MCHC: 33.3 g/dL (ref 30.0–36.0)
MCV: 90.6 fL (ref 78.0–100.0)
Monocytes Absolute: 0.6 10*3/uL (ref 0.1–1.0)
Platelets: 268 10*3/uL (ref 150–400)
RDW: 14.2 % (ref 11.5–15.5)

## 2012-12-10 LAB — BASIC METABOLIC PANEL
BUN: 29 mg/dL — ABNORMAL HIGH (ref 6–23)
CO2: 25 mEq/L (ref 19–32)
Calcium: 10.2 mg/dL (ref 8.4–10.5)
Creatinine, Ser: 1.33 mg/dL (ref 0.50–1.35)
Glucose, Bld: 100 mg/dL — ABNORMAL HIGH (ref 70–99)

## 2012-12-10 NOTE — Telephone Encounter (Signed)
Called pt to let him know that i received nurse note about consent. LM to let him know we will fix the surgical consent the morning of surgery and pls call with any questions - consent should be Lap repair of Left inguinal hernia with mesh possible right repair

## 2012-12-10 NOTE — Progress Notes (Signed)
Dr Andrey Campanile- Lorain Childes-  I just saw Mr Levitz in pre op-  He expressed much concern that his surgical consent did not include possible right inguinal hernia as well.  I informed him you had noted this in your office visit note and patient could review this with you morning of surgery  THANK YOU

## 2012-12-10 NOTE — Progress Notes (Signed)
BMET faxed to Dr Andrey Campanile through [facility]

## 2012-12-15 ENCOUNTER — Encounter (HOSPITAL_COMMUNITY)
Admission: RE | Disposition: A | Discharge status: Home / Self Care | Payer: Self-pay | Source: Ambulatory Visit | Attending: General Surgery

## 2012-12-15 ENCOUNTER — Ambulatory Visit (HOSPITAL_COMMUNITY)
Admission: RE | Admit: 2012-12-15 | Discharge: 2012-12-15 | Disposition: A | Discharge status: Home / Self Care | Payer: Medicare Other | Source: Ambulatory Visit | Attending: General Surgery | Admitting: General Surgery

## 2012-12-15 ENCOUNTER — Encounter (HOSPITAL_COMMUNITY): Payer: Medicare Other | Admitting: Anesthesiology

## 2012-12-15 ENCOUNTER — Ambulatory Visit (HOSPITAL_COMMUNITY): Payer: Medicare Other | Admitting: Anesthesiology

## 2012-12-15 ENCOUNTER — Encounter (HOSPITAL_COMMUNITY): Payer: Self-pay | Admitting: *Deleted

## 2012-12-15 DIAGNOSIS — Z79899 Other long term (current) drug therapy: Secondary | ICD-10-CM | POA: Insufficient documentation

## 2012-12-15 DIAGNOSIS — I10 Essential (primary) hypertension: Secondary | ICD-10-CM | POA: Insufficient documentation

## 2012-12-15 DIAGNOSIS — K4020 Bilateral inguinal hernia, without obstruction or gangrene, not specified as recurrent: Secondary | ICD-10-CM

## 2012-12-15 DIAGNOSIS — Z7982 Long term (current) use of aspirin: Secondary | ICD-10-CM | POA: Insufficient documentation

## 2012-12-15 DIAGNOSIS — K4090 Unilateral inguinal hernia, without obstruction or gangrene, not specified as recurrent: Secondary | ICD-10-CM | POA: Insufficient documentation

## 2012-12-15 DIAGNOSIS — Z87891 Personal history of nicotine dependence: Secondary | ICD-10-CM | POA: Insufficient documentation

## 2012-12-15 HISTORY — PX: INSERTION OF MESH: SHX5868

## 2012-12-15 HISTORY — PX: INGUINAL HERNIA REPAIR: SHX194

## 2012-12-15 SURGERY — REPAIR, HERNIA, INGUINAL, LAPAROSCOPIC
Anesthesia: General | Site: Abdomen | Laterality: Bilateral | Wound class: Clean

## 2012-12-15 MED ORDER — GLYCOPYRROLATE 0.2 MG/ML IJ SOLN
INTRAMUSCULAR | Status: DC | PRN
  Administered 2012-12-15: 0.4 mg via INTRAVENOUS

## 2012-12-15 MED ORDER — DEXAMETHASONE SODIUM PHOSPHATE 4 MG/ML IJ SOLN
INTRAMUSCULAR | Status: DC | PRN
  Administered 2012-12-15: 10 mg via INTRAVENOUS

## 2012-12-15 MED ORDER — BUPIVACAINE-EPINEPHRINE 0.25% -1:200000 IJ SOLN
INTRAMUSCULAR | Status: DC | PRN
  Administered 2012-12-15: 55 mL

## 2012-12-15 MED ORDER — OXYCODONE HCL 5 MG PO TABS: 5.0000 mg | ORAL_TABLET | Freq: Once | ORAL | Status: DC | PRN

## 2012-12-15 MED ORDER — LACTATED RINGERS IV SOLN
INTRAVENOUS | Status: DC | PRN
  Administered 2012-12-15: 08:00:00 via INTRAVENOUS

## 2012-12-15 MED ORDER — ACETAMINOPHEN 325 MG PO TABS: 650.0000 mg | ORAL_TABLET | ORAL | Status: DC | PRN

## 2012-12-15 MED ORDER — ACETAMINOPHEN 650 MG RE SUPP
650.0000 mg | RECTAL | Status: DC | PRN
  Filled 2012-12-15: qty 1

## 2012-12-15 MED ORDER — MIDAZOLAM HCL 5 MG/5ML IJ SOLN
INTRAMUSCULAR | Status: DC | PRN
  Administered 2012-12-15: 0.5 mg via INTRAVENOUS

## 2012-12-15 MED ORDER — EPHEDRINE SULFATE 50 MG/ML IJ SOLN
INTRAMUSCULAR | Status: DC | PRN
  Administered 2012-12-15: 15 mg via INTRAVENOUS
  Administered 2012-12-15: 10 mg via INTRAVENOUS

## 2012-12-15 MED ORDER — ONDANSETRON HCL 4 MG/2ML IJ SOLN
INTRAMUSCULAR | Status: DC | PRN
  Administered 2012-12-15 (×2): 2 mg via INTRAVENOUS

## 2012-12-15 MED ORDER — PROPOFOL INFUSION 10 MG/ML OPTIME
INTRAVENOUS | Status: DC | PRN
  Administered 2012-12-15: 175 mL via INTRAVENOUS

## 2012-12-15 MED ORDER — KETAMINE HCL 10 MG/ML IJ SOLN
INTRAMUSCULAR | Status: DC | PRN
  Administered 2012-12-15: 10 mg via INTRAVENOUS

## 2012-12-15 MED ORDER — OXYCODONE-ACETAMINOPHEN 5-325 MG PO TABS: 1.0000 | ORAL_TABLET | ORAL | Status: DC | PRN

## 2012-12-15 MED ORDER — ONDANSETRON HCL 4 MG/2ML IJ SOLN: 4.0000 mg | INTRAMUSCULAR | Status: DC

## 2012-12-15 MED ORDER — CEFAZOLIN SODIUM-DEXTROSE 2-3 GM-% IV SOLR
2.0000 g | INTRAVENOUS | Status: AC
  Administered 2012-12-15: 2 g via INTRAVENOUS

## 2012-12-15 MED ORDER — SODIUM CHLORIDE 0.9 % IJ SOLN: 3.0000 mL | INTRAMUSCULAR | Status: DC | PRN

## 2012-12-15 MED ORDER — FENTANYL CITRATE 0.05 MG/ML IJ SOLN
INTRAMUSCULAR | Status: DC | PRN
  Administered 2012-12-15 (×4): 25 ug via INTRAVENOUS
  Administered 2012-12-15 (×2): 50 ug via INTRAVENOUS

## 2012-12-15 MED ORDER — SODIUM CHLORIDE 0.9 % IV SOLN: 250.0000 mL | INTRAVENOUS | Status: DC | PRN

## 2012-12-15 MED ORDER — ONDANSETRON HCL 4 MG/2ML IJ SOLN: 4.0000 mg | Freq: Four times a day (QID) | INTRAMUSCULAR | Status: DC | PRN

## 2012-12-15 MED ORDER — MORPHINE SULFATE 10 MG/ML IJ SOLN: 1.0000 mg | INTRAMUSCULAR | Status: DC | PRN

## 2012-12-15 MED ORDER — SODIUM CHLORIDE 0.9 % IJ SOLN: 3.0000 mL | Freq: Two times a day (BID) | INTRAMUSCULAR | Status: DC

## 2012-12-15 MED ORDER — OXYCODONE HCL 5 MG/5ML PO SOLN
5.0000 mg | Freq: Once | ORAL | Status: DC | PRN
  Filled 2012-12-15: qty 5

## 2012-12-15 MED ORDER — BUPIVACAINE-EPINEPHRINE PF 0.25-1:200000 % IJ SOLN
INTRAMUSCULAR | Status: AC
  Filled 2012-12-15: qty 30

## 2012-12-15 MED ORDER — PROMETHAZINE HCL 25 MG/ML IJ SOLN: 6.2500 mg | INTRAMUSCULAR | Status: DC | PRN

## 2012-12-15 MED ORDER — ESMOLOL HCL 10 MG/ML IV SOLN
INTRAVENOUS | Status: DC | PRN
  Administered 2012-12-15: 20 mg via INTRAVENOUS

## 2012-12-15 MED ORDER — NEOSTIGMINE METHYLSULFATE 1 MG/ML IJ SOLN
INTRAMUSCULAR | Status: DC | PRN
  Administered 2012-12-15: 3 mg via INTRAVENOUS

## 2012-12-15 MED ORDER — LIDOCAINE HCL (CARDIAC) 20 MG/ML IV SOLN
INTRAVENOUS | Status: DC | PRN
  Administered 2012-12-15: 30 mg via INTRAVENOUS

## 2012-12-15 MED ORDER — BUPIVACAINE-EPINEPHRINE PF 0.5-1:200000 % IJ SOLN
INTRAMUSCULAR | Status: AC
  Filled 2012-12-15: qty 30

## 2012-12-15 MED ORDER — MEPERIDINE HCL 50 MG/ML IJ SOLN: 6.2500 mg | INTRAMUSCULAR | Status: DC | PRN

## 2012-12-15 MED ORDER — 0.9 % SODIUM CHLORIDE (POUR BTL) OPTIME
TOPICAL | Status: DC | PRN
  Administered 2012-12-15: 1000 mL

## 2012-12-15 MED ORDER — CISATRACURIUM BESYLATE (PF) 10 MG/5ML IV SOLN
INTRAVENOUS | Status: DC | PRN
  Administered 2012-12-15 (×2): 5 mg via INTRAVENOUS
  Administered 2012-12-15: 1 mg via INTRAVENOUS
  Administered 2012-12-15: 3 mg via INTRAVENOUS

## 2012-12-15 MED ORDER — HYDROMORPHONE HCL PF 1 MG/ML IJ SOLN: 0.2500 mg | INTRAMUSCULAR | Status: DC | PRN

## 2012-12-15 MED ORDER — ONDANSETRON HCL 4 MG/2ML IJ SOLN
4.0000 mg | INTRAMUSCULAR | Status: AC
  Administered 2012-12-15: 4 mg via INTRAVENOUS
  Filled 2012-12-15: qty 2

## 2012-12-15 MED ORDER — SUCCINYLCHOLINE CHLORIDE 20 MG/ML IJ SOLN
INTRAMUSCULAR | Status: DC | PRN
  Administered 2012-12-15: 100 mg via INTRAVENOUS

## 2012-12-15 MED ORDER — OXYCODONE HCL 5 MG PO TABS: 5.0000 mg | ORAL_TABLET | ORAL | Status: DC | PRN

## 2012-12-15 MED ORDER — CEFAZOLIN SODIUM-DEXTROSE 2-3 GM-% IV SOLR
INTRAVENOUS | Status: AC
  Filled 2012-12-15: qty 50

## 2012-12-15 SURGICAL SUPPLY — 42 items
APL SKNCLS STERI-STRIP NONHPOA (GAUZE/BANDAGES/DRESSINGS)
APPLICATOR DUAL LIQUID (MISCELLANEOUS) ×1 IMPLANT
BANDAGE ADHESIVE 1X3 (GAUZE/BANDAGES/DRESSINGS) IMPLANT
BENZOIN TINCTURE PRP APPL 2/3 (GAUZE/BANDAGES/DRESSINGS) IMPLANT
CANISTER SUCTION 2500CC (MISCELLANEOUS) ×2 IMPLANT
CLOTH BEACON ORANGE TIMEOUT ST (SAFETY) ×1 IMPLANT
DECANTER SPIKE VIAL GLASS SM (MISCELLANEOUS) ×1 IMPLANT
DEVICE SECURE STRAP 25 ABSORB (INSTRUMENTS) ×1 IMPLANT
DRAPE LAPAROSCOPIC ABDOMINAL (DRAPES) ×2 IMPLANT
DRAPE UTILITY XL STRL (DRAPES) ×2 IMPLANT
DRSG TEGADERM 2-3/8X2-3/4 SM (GAUZE/BANDAGES/DRESSINGS) IMPLANT
DRSG TEGADERM 4X4.75 (GAUZE/BANDAGES/DRESSINGS) IMPLANT
ELECT REM PT RETURN 9FT ADLT (ELECTROSURGICAL) ×2
ELECTRODE REM PT RTRN 9FT ADLT (ELECTROSURGICAL) ×1 IMPLANT
GLOVE BIO SURGEON STRL SZ7.5 (GLOVE) ×2 IMPLANT
GLOVE BIOGEL M STRL SZ7.5 (GLOVE) IMPLANT
GLOVE BIOGEL PI IND STRL 7.0 (GLOVE) IMPLANT
GLOVE BIOGEL PI INDICATOR 7.0 (GLOVE) ×2
GLOVE ECLIPSE 6.5 STRL STRAW (GLOVE) ×1 IMPLANT
GLOVE INDICATOR 8.0 STRL GRN (GLOVE) ×2 IMPLANT
GOWN PREVENTION PLUS LG XLONG (DISPOSABLE) ×2 IMPLANT
GOWN STRL REIN XL XLG (GOWN DISPOSABLE) ×5 IMPLANT
KIT BASIN OR (CUSTOM PROCEDURE TRAY) ×2 IMPLANT
MESH ULTRAPRO 3X6 7.6X15CM (Mesh General) ×1 IMPLANT
MESH ULTRAPRO 6X6 15CM15CM (Mesh General) ×1 IMPLANT
NS IRRIG 1000ML POUR BTL (IV SOLUTION) ×2 IMPLANT
RELOAD STAPLE 4.8 BLK F/HERNIA (STAPLE) IMPLANT
RELOAD STAPLE HERNIA 4.8 BLK (STAPLE) ×4 IMPLANT
SCISSORS LAP 5X35 DISP (ENDOMECHANICALS) ×1 IMPLANT
SET IRRIG TUBING LAPAROSCOPIC (IRRIGATION / IRRIGATOR) IMPLANT
SHEARS CURVED HARMONIC AC 45CM (MISCELLANEOUS) IMPLANT
SOLUTION ANTI FOG 6CC (MISCELLANEOUS) ×2 IMPLANT
STAPLER HERNIA 12 8.5 360D (INSTRUMENTS) ×1 IMPLANT
STRIP CLOSURE SKIN 1/2X4 (GAUZE/BANDAGES/DRESSINGS) IMPLANT
SUT MNCRL AB 4-0 PS2 18 (SUTURE) ×2 IMPLANT
SUT VICRYL 0 UR6 27IN ABS (SUTURE) ×1 IMPLANT
TOWEL OR 17X26 10 PK STRL BLUE (TOWEL DISPOSABLE) ×2 IMPLANT
TOWEL OR NON WOVEN STRL DISP B (DISPOSABLE) ×1 IMPLANT
TRAY LAP CHOLE (CUSTOM PROCEDURE TRAY) ×2 IMPLANT
TROCAR BLADELESS OPT 5 75 (ENDOMECHANICALS) ×4 IMPLANT
TROCAR XCEL BLUNT TIP 100MML (ENDOMECHANICALS) ×2 IMPLANT
TUBING INSUFFLATION 10FT LAP (TUBING) ×2 IMPLANT

## 2012-12-15 NOTE — Anesthesia Postprocedure Evaluation (Signed)
Anesthesia Post Note  Patient: Matthew Cannon  Procedure(s) Performed: Procedure(s) (LRB): LAPAROSCOPIC BILATERAL INGUINAL HERNIA LEFT DIRECT AND INDIRECT HERNIA  AND RIGHT DIRECT HERNIA WITH MESH (Bilateral) INSERTION OF MESH BILATERAL (Bilateral)  Anesthesia type: General  Patient location: PACU  Post pain: Pain level controlled  Post assessment: Post-op Vital signs reviewed  Last Vitals: BP 111/68  Pulse 72  Temp(Src) 36.2 C (Oral)  Resp 18  SpO2 95%  Post vital signs: Reviewed  Level of consciousness: sedated  Complications: No apparent anesthesia complications

## 2012-12-15 NOTE — Transfer of Care (Signed)
Immediate Anesthesia Transfer of Care Note  Patient: Matthew Cannon  Procedure(s) Performed: Procedure(s) with comments: LAPAROSCOPIC BILATERAL INGUINAL HERNIA LEFT DIRECT AND INDIRECT HERNIA  AND RIGHT DIRECT HERNIA WITH MESH (Bilateral) INSERTION OF MESH BILATERAL (Bilateral) - INGUINAL  Patient Location: PACU  Anesthesia Type:General  Level of Consciousness: awake, alert , oriented and patient cooperative  Airway & Oxygen Therapy: Patient Spontanous Breathing and Patient connected to face mask oxygen  Post-op Assessment: Report given to PACU RN and Post -op Vital signs reviewed and stable  Post vital signs: stable  Complications: No apparent anesthesia complications

## 2012-12-15 NOTE — Op Note (Signed)
12/15/2012  Matthew Cannon 03-29-1940 [id number]  PREOPERATIVE DIAGNOSIS: left inguinal hernia, possible right  POSTOPERATIVE DIAGNOSIS: left indirect and direct inguinal hernia; right direct inguinal hernia  PROCEDURE: Laparoscopic repair of left indirect and direct inguinal hernias and right direct inguinal hernia with mesh (TAPP).   SURGEON: Mary Sella. Andrey Campanile, MD   ASSISTANT SURGEON: None.   ANESTHESIA: General plus local consisting of 0.25% Marcaine with epi.   ESTIMATED BLOOD LOSS: Minimal.   FINDINGS: The patient had a pantaloon hernia on the left and small direct hernia on the right. They were repaired using a 3 inch x 6 inch piece of Ethicon UltraPro mesh.   SPECIMEN: none  INDICATIONS FOR PROCEDURE: 71yo with symptomatic left inguinal hernia and a possible right inguinal hernia who desired repair. The risks and benefits including but not limited to bleeding, infection, chronic inguinal pain, nerve entrapment, hernia recurrence, mesh complications, hematoma formation, urinary retention, injury to the testicles or the ovaries, numbness in the groin, blood clots, injury to the surrounding structures, and anesthesia risk was discussed with the patient.  DESCRIPTION OF PROCEDURE: After obtaining verbal consent and marking  the both groins in the holding area with the patient confirming the  operative site, the patient was then taken back to the operating room, placed  supine on the operating room table. General endotracheal anesthesia was  established. The patient had emptied their bladder prior to going back to  the operating room. Sequential compression devices were placed. The  abdomen and groin were prepped and draped in the usual standard surgical  fashion with ChloraPrep. The patient received IV ancef prior to the incision. A surgical time-out was performed. Local was infiltrated at the base of the umbilicus.   Next, a 1-cm vertical infraumbilical incision was made with  a #11 blade. The fascia  was grasped and lifted anteriorly. Next, the fascia was incised, and  the abdominal cavity was entered. Pursestring suture was placed around  the fascial edges using a 0 Vicryl. A 12-mm Hasson trocar was placed.  Pneumoperitoneum was smoothly established up to a patient pressure of 15  mmHg. Laparoscope was advanced. There was evidence of a  contralateral small direct hernia. The patient had a defect both medial and lateral to  the inferior epigastric vessel, consistent with an direct and  Indirect left   hernia. Two 5-mm trocars were placed, one on the right, one on the left  in the midclavicular line slightly above the level of the umbilicus all  under direct visualization. After local had been infiltrated, I then  made incision along the peritoneum on the left, starting 2 inches above  the anterior superior iliac spine and caring it medial  toward the median umbilical ligament in a lazy S configuration using  Endo Shears with electrocautery. The peritoneal flap was then gently  dissected downward from the anterior abdominal wall taking care not to  injure the inferior epigastric vessels. The pubic bone was identified.  The testicular vessels were identified.  Using  traction and counter traction with short graspers, I reduced the sac in  its entirety. The testicular vessels had been identified and preserved. The vas deferens was identified and preserved, and the hernia sac was stripped from those to  surrounding structures. I then went about creating a large pocket by  lifting the peritoneum of the pelvic floor. I took great care not to  injure the iliac vessels.   I then turned my attention to the right side and created  a similar flap on the right. The cord structures were identified and preserved along with inferior epigastric vessels. The direct hernia was reduced and I created a large pocket to accommodate mesh by lifting the peritoneum off the pelvic floor  structures    Local anesthetic was injected 2 finger breadths below and medial to the anterior superior iliac spine as well as along both groins prior to placing the mesh. I then obtained a piece of Ethicon UltraPro mesh 6 inch x  6 inch and cut it in half, placed a 3x6 inch piece  through the Hasson trocar into right groin, half of it covered medial to the inferior epigastric vessels and half of it lateral to the  inferior epigastric vessels. The defect was well  covered with the mesh. I then secured the mesh to the abdominal wall  using an covidien Universal hernia stapler. staplers were placed through  the Cooper's ligament, one staples on each side of the inferior epigastric  vessel and twostaples out laterally. No staples were placed below the  shelving edge of the inguinal ligament. I then turned back to the left groin. Unfortunately the other half of the mesh had become contaminated by falling on the floor so a new 3x6 inch ultrapro mesh was obtained and placed into the left and secured in a similar fashion to the abdominal wall with the hernia stapler. Pneumoperitoneum was reduced to 8 mmHg. I then brought the peritoneal flap back up to the abdominal wall and tacked it to the abdominal wall using 6 staples per sides. There was no  defect in the peritoneum, and the mesh was well covered. I removed the  Hasson trocar and tied down the previously placed pursestring suture.  The closure was viewed laparoscopically. There was no evidence of  fascial defect. There was no air leak at the umbilicus. There was no  evidence of injury to surrounding structures. Pneumoperitoneum was  released, and the remaining trocars were removed. All skin incisions  were closed with a 4-0 Monocryl in a subcuticular fashion followed by  application of Dermabond. All needle, instrument, and sponge counts  were correct x2. There are no immediate complications. The patient  tolerated the procedure well. The patient was  extubated and taken to the  recovery room in stable condition.  Mary Sella. Andrey Campanile, MD, FACS General, Bariatric, & Minimally Invasive Surgery [facility], [place]

## 2012-12-15 NOTE — Progress Notes (Addendum)
Phase 2 Short Stay post op 30 minutes after arriving to short stay pt c/o of slight nausea. Orders received from Dr Towanda Octave and pt was medicated with zofran 4 mg. Pt slept for approx  1 hour . And states he feels better. Ambulated to BR with minimal assist and voided moderate amt clearl yellow urine. Tolerated this well but became dizzy as walking back to stretcher and is lying down at this time

## 2012-12-15 NOTE — Anesthesia Preprocedure Evaluation (Addendum)
Anesthesia Evaluation  Patient identified by MRN, date of birth, ID band Patient awake    Reviewed: Allergy & Precautions, H&P , NPO status , Patient's Chart, lab work & pertinent test results  Airway       Dental  (+) Dental Advisory Given   Pulmonary former smoker,          Cardiovascular hypertension, Pt. on medications     Neuro/Psych negative neurological ROS  negative psych ROS   GI/Hepatic negative GI ROS, Neg liver ROS,   Endo/Other  negative endocrine ROS  Renal/GU negative Renal ROS  negative genitourinary   Musculoskeletal negative musculoskeletal ROS (+)   Abdominal   Peds negative pediatric ROS (+)  Hematology negative hematology ROS (+)   Anesthesia Other Findings   Reproductive/Obstetrics                           Anesthesia Physical Anesthesia Plan  ASA: II  Anesthesia Plan: General   Post-op Pain Management:    Induction: Intravenous  Airway Management Planned:   Additional Equipment:   Intra-op Plan:   Post-operative Plan: Extubation in OR  Informed Consent:   Dental advisory given  Plan Discussed with: CRNA  Anesthesia Plan Comments:         Anesthesia Quick Evaluation

## 2012-12-15 NOTE — Progress Notes (Signed)
Phase 2 Short Stay post op: Able to ambulate in hall with minimal assist and no dizziness. Eager for discharge

## 2012-12-15 NOTE — H&P (Signed)
Matthew Cannon is an 72 y.o. male.   Chief Complaint: here for surgery HPI: 72 year old Caucasian male referred by Dr. Selena Batten for evaluation of a left inguinal hernia. The patient states that he noticed it about a year ago however over the past several months it has slowly gotten larger. It doesn't really cause him any discomfort. He thinks it looks abnormal. He denies any nausea, vomiting, diarrhea or constipation. He denies any weight change. He has had a vasectomy. He has never had any prior abdominal surgery Wonders if he has rih as well  Past Medical History  Diagnosis Date  . Hypertension     Past Surgical History  Procedure Laterality Date  . Foot surgery      had bone spur  . Colonoscopy    . Vasectomy      Family History  Problem Relation Age of Onset  . Cancer Daughter     breast   Social History:  reports that he quit smoking about 31 years ago. He has never used smokeless tobacco. He reports that he drinks alcohol. He reports that he does not use illicit drugs.  Allergies:  Allergies  Allergen Reactions  . Zithromax [Azithromycin] Diarrhea    Medications Prior to Admission  Medication Sig Dispense Refill  . aspirin 81 MG tablet Take 81 mg by mouth daily as needed for pain.      [name] Kitchen lisinopril (PRINIVIL,ZESTRIL) 10 MG tablet Take 10 mg by mouth every evening.      . tamsulosin (FLOMAX) 0.4 MG CAPS capsule Take 1 capsule (0.4 mg total) by mouth daily. Start taking 3 days before surgery  7 capsule  0    No results found for this or any previous visit (from the past 48 hour(s)). No results found.  Review of Systems  Constitutional: Negative for weight loss.  HENT: Negative for nosebleeds.   Eyes: Negative for blurred vision.  Respiratory: Negative for shortness of breath.   Cardiovascular: Negative for chest pain, palpitations, orthopnea and PND.       Denies DOE  Gastrointestinal: Negative for nausea, vomiting and abdominal pain.  Genitourinary: Negative for  dysuria and hematuria.  Musculoskeletal: Negative.   Skin: Negative for itching and rash.       Had a splinter in l thumb  Neurological: Negative for dizziness, focal weakness, seizures, loss of consciousness and headaches.       Denies TIAs, amaurosis fugax  Endo/Heme/Allergies: Does not bruise/bleed easily.  Psychiatric/Behavioral: The patient is not nervous/anxious.     Blood pressure 146/76, pulse 70, temperature 97.6 F (36.4 C), temperature source Oral, resp. rate 16, SpO2 98.00%. Physical Exam  Vitals reviewed. Constitutional: He is oriented to person, place, and time. He appears well-developed and well-nourished. No distress.  HENT:  Head: Normocephalic and atraumatic.  Right Ear: External ear normal.  Left Ear: External ear normal.  Eyes: Conjunctivae are normal. No scleral icterus.  Neck: Normal range of motion. No tracheal deviation present.  Cardiovascular: Normal rate and normal heart sounds.   Respiratory: Effort normal. No stridor. No respiratory distress.  GI: Soft. He exhibits no distension. There is no tenderness. There is no rebound.  Musculoskeletal: He exhibits no edema and no tenderness.  Neurological: He is alert and oriented to person, place, and time. He exhibits normal muscle tone.  Skin: Skin is warm and dry. No rash noted. He is not diaphoretic. No erythema. No pallor.  Small deep subcu knot of medial aspect of l thumb - no induration,  cellulitis  Psychiatric: He has a normal mood and affect. His behavior is normal. Judgment and thought content normal.     Assessment/Plan Left inguinal hernia Questionable RIH  To or for Lap LIH, possible lap RIH repair as well All questions asked and answered  Mary Sella. Andrey Campanile, MD, FACS General, Bariatric, & Minimally Invasive Surgery [facility], [place]   [facility] M 12/15/2012, 7:59 AM

## 2012-12-16 ENCOUNTER — Encounter (HOSPITAL_COMMUNITY): Payer: Self-pay | Admitting: General Surgery

## 2012-12-18 ENCOUNTER — Telehealth (INDEPENDENT_AMBULATORY_CARE_PROVIDER_SITE_OTHER): Payer: Self-pay | Admitting: General Surgery

## 2012-12-18 NOTE — Telephone Encounter (Signed)
He called last night complaining of constipation.  He has been trying the colace and not having much results with this.  He is having some abdominal cramping as well but abdomen is not distended.  I think that it would be okay to try some over the counter miralax to see if bowel movement would help with his discomfort.  If continued distension, then he should come to ER for xrays and exam.

## 2013-01-13 ENCOUNTER — Encounter (INDEPENDENT_AMBULATORY_CARE_PROVIDER_SITE_OTHER): Payer: Self-pay | Admitting: General Surgery

## 2013-01-13 ENCOUNTER — Ambulatory Visit (INDEPENDENT_AMBULATORY_CARE_PROVIDER_SITE_OTHER): Payer: Medicare Other | Admitting: General Surgery

## 2013-01-13 VITALS — BP 126/80 | HR 83 | Temp 98.0°F | Resp 14 | Ht 70.0 in | Wt 201.0 lb

## 2013-01-13 DIAGNOSIS — Z09 Encounter for follow-up examination after completed treatment for conditions other than malignant neoplasm: Secondary | ICD-10-CM

## 2013-01-13 NOTE — Progress Notes (Signed)
Subjective:     Patient ID: Matthew Cannon, male   DOB: 1940/12/02, 72 y.o.   MRN: [id number]  HPI 72 year old Caucasian male comes in for followup after undergoing laparoscopic inguinal hernia repair. At the time of surgery we just thought he had a left inguinal hernia. During surgery he was found to have both a left indirect and direct hernia as well as a right direct inguinal hernia. All of these hernias were repaired at the same time laparoscopically. He states he did quite well after surgery. He had a little bit of constipation but now reports he is normal. He denies any inguinal pain. He denies any swelling. He denies any difficulty urinating. He denies any fevers or chills. He denies any abdominal pain. He states about one week ago he started to notice a "dampy" sensation on his left upper posterior thigh. He denies any weakness in that extremity.  Review of Systems     Objective:   Physical Exam BP 126/80  Pulse 83  Temp(Src) 98 F (36.7 C) (Temporal)  Resp 14  Ht 5\' 10"  (1.778 m)  Wt 201 lb (91.173 kg)  BMI 28.84 kg/m2 Alert, no apparent distress Abdomen-soft, nontender, nondistended. Well-healed trocar incisions. No cellulitis GU-no scrotal hematoma. No inguinal hematoma. Both testicles descended. No evidence of hernia recurrence with valsalva maneuvers    Assessment:     Status post laparoscopic repair of left pantaloon inguinal hernia and right direct inguinal hernia     Plan:     Overall he is doing great. We reviewed the intraoperative photographs. I am not sure what the etiology of this damp sensation is. I recommended just keeping an eye on it.  I advised him he can slowly resume full activities. Since he is doing so well I will see him on an as-needed basis.  Mary Sella. Andrey Campanile, MD, FACS General, Bariatric, & Minimally Invasive Surgery [facility], [place]

## 2013-01-13 NOTE — Patient Instructions (Signed)
Can gradually resume full activities

## 2013-07-11 ENCOUNTER — Ambulatory Visit: Payer: Self-pay | Admitting: Podiatry

## 2013-07-20 ENCOUNTER — Ambulatory Visit (INDEPENDENT_AMBULATORY_CARE_PROVIDER_SITE_OTHER): Payer: Medicare Other | Admitting: Podiatry

## 2013-07-20 ENCOUNTER — Ambulatory Visit (INDEPENDENT_AMBULATORY_CARE_PROVIDER_SITE_OTHER): Payer: Medicare Other

## 2013-07-20 DIAGNOSIS — R52 Pain, unspecified: Secondary | ICD-10-CM

## 2013-07-20 DIAGNOSIS — L84 Corns and callosities: Secondary | ICD-10-CM

## 2013-07-20 DIAGNOSIS — M898X9 Other specified disorders of bone, unspecified site: Secondary | ICD-10-CM

## 2013-07-20 NOTE — Progress Notes (Signed)
   Subjective:    Patient ID: Matthew Cannon, male    DOB: 02-14-1940, 73 y.o.   MRN: [id number]  HPI PT STATED LT FOOT 5TH TOE THE CORN CAME BACK AND IS BEEN HURTING FOR 1 MONTH. THE TOE IS NOT GETTING WORSE. THE TOE GET AGGRAVATED BY PRESSURE AND WALKING. TRIED NO TREATMENT.   Review of Systems  All other systems reviewed and are negative.      Objective:   Physical Exam        Assessment & Plan:

## 2013-07-20 NOTE — Progress Notes (Signed)
Subjective:     Patient ID: Matthew Cannon, male   DOB: 06-Aug-1940, 73 y.o.   MRN: [id number]  HPI patient presents with a left fifth toe hurting stating that he has trouble wearing shoes when it flares up   Review of Systems  All other systems reviewed and are negative.      Objective:   Physical Exam  Nursing note and vitals reviewed. Constitutional: He is oriented to person, place, and time.  Musculoskeletal: Normal range of motion.  Neurological: He is oriented to person, place, and time.  Skin: Skin is warm.   neurovascular status intact with patient having good range of motion subtalar midtarsal joint good muscle strength and mild equinus. Digits are well perfused arch height is mildly depressed and I noted there to be a painful keratotic lesion on the inner side of the fifth toe pressing against the fourth toe     Assessment:     Exostosis fifth toe with corn callus formation    Plan:     H&P performed and condition explained. Debris did lesion which patient tolerated well and reappoint her recheck

## 2013-11-18 ENCOUNTER — Other Ambulatory Visit: Payer: Self-pay

## 2014-02-03 HISTORY — PX: OTHER SURGICAL HISTORY: SHX169

## 2014-07-31 ENCOUNTER — Other Ambulatory Visit: Payer: Self-pay

## 2015-02-27 ENCOUNTER — Other Ambulatory Visit: Payer: Self-pay | Admitting: Internal Medicine

## 2015-02-27 DIAGNOSIS — R55 Syncope and collapse: Secondary | ICD-10-CM

## 2015-03-02 ENCOUNTER — Ambulatory Visit
Admission: RE | Admit: 2015-03-02 | Discharge: 2015-03-02 | Disposition: A | Discharge status: Home / Self Care | Payer: Medicare Other | Source: Ambulatory Visit | Attending: Internal Medicine | Admitting: Internal Medicine

## 2015-03-02 DIAGNOSIS — R55 Syncope and collapse: Secondary | ICD-10-CM

## 2015-03-05 ENCOUNTER — Encounter: Payer: Self-pay | Admitting: Internal Medicine

## 2015-03-19 DIAGNOSIS — R001 Bradycardia, unspecified: Secondary | ICD-10-CM | POA: Diagnosis not present

## 2015-03-20 ENCOUNTER — Encounter: Payer: Self-pay | Admitting: Internal Medicine

## 2015-03-20 DIAGNOSIS — R55 Syncope and collapse: Secondary | ICD-10-CM | POA: Diagnosis not present

## 2015-03-27 DIAGNOSIS — D2272 Melanocytic nevi of left lower limb, including hip: Secondary | ICD-10-CM | POA: Diagnosis not present

## 2015-03-27 DIAGNOSIS — D1801 Hemangioma of skin and subcutaneous tissue: Secondary | ICD-10-CM | POA: Diagnosis not present

## 2015-03-27 DIAGNOSIS — L814 Other melanin hyperpigmentation: Secondary | ICD-10-CM | POA: Diagnosis not present

## 2015-03-27 DIAGNOSIS — L570 Actinic keratosis: Secondary | ICD-10-CM | POA: Diagnosis not present

## 2015-03-27 DIAGNOSIS — Z85820 Personal history of malignant melanoma of skin: Secondary | ICD-10-CM | POA: Diagnosis not present

## 2015-03-27 DIAGNOSIS — L821 Other seborrheic keratosis: Secondary | ICD-10-CM | POA: Diagnosis not present

## 2015-03-27 DIAGNOSIS — Z85828 Personal history of other malignant neoplasm of skin: Secondary | ICD-10-CM | POA: Diagnosis not present

## 2015-03-27 DIAGNOSIS — D2271 Melanocytic nevi of right lower limb, including hip: Secondary | ICD-10-CM | POA: Diagnosis not present

## 2015-04-03 DIAGNOSIS — R55 Syncope and collapse: Secondary | ICD-10-CM | POA: Diagnosis not present

## 2015-04-12 DIAGNOSIS — R55 Syncope and collapse: Secondary | ICD-10-CM | POA: Diagnosis not present

## 2015-04-12 DIAGNOSIS — I480 Paroxysmal atrial fibrillation: Secondary | ICD-10-CM | POA: Diagnosis not present

## 2015-04-12 DIAGNOSIS — R001 Bradycardia, unspecified: Secondary | ICD-10-CM | POA: Diagnosis not present

## 2015-04-23 ENCOUNTER — Ambulatory Visit (INDEPENDENT_AMBULATORY_CARE_PROVIDER_SITE_OTHER): Payer: Medicare Other | Admitting: Neurology

## 2015-04-23 ENCOUNTER — Encounter: Payer: Self-pay | Admitting: Neurology

## 2015-04-23 VITALS — BP 112/78 | HR 66 | Resp 20 | Ht 70.0 in | Wt 186.0 lb

## 2015-04-23 DIAGNOSIS — G4730 Sleep apnea, unspecified: Secondary | ICD-10-CM | POA: Diagnosis not present

## 2015-04-23 DIAGNOSIS — I480 Paroxysmal atrial fibrillation: Secondary | ICD-10-CM

## 2015-04-23 DIAGNOSIS — G4710 Hypersomnia, unspecified: Secondary | ICD-10-CM | POA: Diagnosis not present

## 2015-04-23 DIAGNOSIS — R040 Epistaxis: Secondary | ICD-10-CM | POA: Diagnosis not present

## 2015-04-23 DIAGNOSIS — R0683 Snoring: Secondary | ICD-10-CM

## 2015-04-23 DIAGNOSIS — R351 Nocturia: Secondary | ICD-10-CM | POA: Diagnosis not present

## 2015-04-23 NOTE — Patient Instructions (Signed)

## 2015-04-23 NOTE — Progress Notes (Signed)
SLEEP MEDICINE CLINIC   Provider:  Larey Seat, M D  Referring Provider: Jani Gravel, MD Primary Care Physician:  Jani Gravel, MD  Chief Complaint  Patient presents with  . New Patient (Initial Visit)    afib, never had sleep study, rm 63, with wife    HPI:  Matthew Cannon is a 75 y.o. male , seen here as a referral from Dr. Einar Gip for a sleep evaluation, to rule out in the setting of atrial fibrillation.   The patient had undergone a 30 day cardiac monitor , which captured 4 atrial fib attacks, 3 in sleep. He started on 3 medications , which he doesn't tolerate well. He feels sick and clammy on the medications. He has chronic rhinitis and sinusitis.  Mr. Matthew Cannon has undergone 2 Mohs surgeries, one on the left neck just below the lower jaw and one on the high forehead. This will be important should CPAP treatment be indicated after our evaluation is completed.  Mr. Grizzard medical records speak of a cardiac workup following syncope spells there were 3 near syncopal episodes in October of November 2016 the first one occurred while he was still working in the heat outside and he may have been dehydrated or overheated, the second and third Near-syncope occurred inside without any exertion. The symptoms lasted each time between 4 and 5 minutes or even less. He had no chest pain or tightness and no shortness of breath associated Dr. Glennis Brink workup included an event monitor and echocardiogram and a cardiac stress test he continued to have intermittent episodes of dizziness.  His current medications include Xarelto, lisinopril he is now also on flecainide and metoprolol. The patient has a very remote smoking history of one pack per day until 1983. He is a modest alcohol drinker with 3 drinks per week and does not use caffeine aged beverages.  Sleep habits are as follows: The patient usually goes to bed between 10 and 11 PM, he is currently using one small pillow but used multiple  pillows as a head rest before. He prefers the supine sleep position, the bedroom is cool, quiet and dark. He has nocturia 4-6 times each night, his sinus blockage makes breathing difficulties and he snores.  He rises at 7 AM , often already awake after 5 AM. He doesn't wake with or from headaches and he is usually well refreshed. No alarm needed. He feels not restless/   Sleep medical history and family sleep history:  None known.   Social history: married, retired, 2 sisters, 3 adult children. Non smoker , 3 glasses of ETOH per week.   Review of Systems: Out of a complete 14 system review, the patient complains of only the following symptoms, and all other reviewed systems are negative. How likely are you to doze in the following situations: 0 = not likely, 1 = slight chance, 2 = moderate chance, 3 = high chance  Sitting and Reading? Watching Television? Sitting inactive in a public place (theater or meeting)? Lying down in the afternoon when circumstances permit? Sitting and talking to someone? Sitting quietly after lunch without alcohol? In a car, while stopped for a few minutes in traffic? As a passenger in a car for an hour without a break?  Total = 15   Fatigue severity score 24  , depression score n/a    Social History   Social History  . Marital Status: Married    Spouse Name: N/A  . Number of Children: N/A  .  Years of Education: N/A   Occupational History  . Not on file.   Social History Main Topics  . Smoking status: Former Smoker    Quit date: 03/06/1981  . Smokeless tobacco: Never Used  . Alcohol Use: Yes     Comment: 4 x week, 1-2 mixed drinks  . Drug Use: No  . Sexual Activity: Not on file   Other Topics Concern  . Not on file   Social History Narrative    Family History  Problem Relation Age of Onset  . Cancer Daughter     breast  . Suicidality Father   . Alcohol abuse Father     Past Medical History  Diagnosis Date  . Hypertension   .  Bradycardia   . PAF (paroxysmal atrial fibrillation) [facility])     Past Surgical History  Procedure Laterality Date  . Foot surgery      had bone spur  . Colonoscopy    . Vasectomy    . Inguinal hernia repair Bilateral 12/15/2012    Procedure: LAPAROSCOPIC BILATERAL INGUINAL HERNIA LEFT DIRECT AND INDIRECT HERNIA  AND RIGHT DIRECT HERNIA WITH MESH;  Surgeon: Gayland Curry, MD;  Location: [facility];  Service: General;  Laterality: Bilateral;  . Insertion of mesh Bilateral 12/15/2012    Procedure: INSERTION OF MESH BILATERAL;  Surgeon: Gayland Curry, MD;  Location: [facility];  Service: General;  Laterality: Bilateral;  INGUINAL    Current Outpatient Prescriptions  Medication Sig Dispense Refill  . aspirin 81 MG tablet Take 81 mg by mouth daily as needed for pain.    . flecainide (TAMBOCOR) 50 MG tablet TK 1 T PO BID  1  . fluorouracil (EFUDEX) 5 % cream Apply topically 2 (two) times daily.    . metoprolol tartrate (LOPRESSOR) 25 MG tablet TK 1/2 T PO BID  0  . rivaroxaban (XARELTO) 20 MG TABS tablet Take 20 mg by mouth daily with supper.     No current facility-administered medications for this visit.    Allergies as of 04/23/2015 - Review Complete 04/23/2015  Allergen Reaction Noted  . Zithromax [azithromycin] Diarrhea 06/11/2012    Vitals: BP 112/78 mmHg  Pulse 66  Resp 20  Ht 5\' 10"  (1.778 m)  Wt 186 lb (84.369 kg)  BMI 26.69 kg/m2 Last Weight:  Wt Readings from Last 1 Encounters:  04/23/15 186 lb (84.369 kg)   [id number] mass index is 26.69 kg/(m^2).     Last Height:   Ht Readings from Last 1 Encounters:  04/23/15 5\' 10"  (1.778 m)    Physical exam:  General: The patient is awake, alert and appears not in acute distress. The patient is well groomed. Head: Normocephalic, atraumatic. Neck is supple. Mallampati 2, deviated to the left. ,  neck circumference: 16.5 . Nasal airflow restricted ,Retrognathia is not seen.  Cardiovascular:  Regular rate and rhythm, without  murmurs or  carotid bruit, and without distended neck veins. Respiratory: Lungs are clear to auscultation. Skin:  Without evidence of edema, or rash Trunk: BMI is normal . The patient's posture is erect  Neurologic exam : The patient is awake and alert, oriented to place and time.   Memory subjective  described as intact.  Attention span & concentration ability appears normal.  Speech is fluent,  with dysphonia , not  aphasia.  Mood and affect are appropriate.  Cranial nerves: Pupils are equal and briskly reactive to light. Extraocular movements  in vertical and horizontal planes intact and without nystagmus.  Visual fields by finger perimetry are intact. Hearing to finger rub intact.   Facial sensation intact to fine touch.  Facial motor strength is symmetric and tongue and uvula move midline. Shoulder shrug was symmetrical.   Motor exam:  Normal tone, muscle bulk and symmetric strength in all extremities. Sensory:  Fine touch, pinprick and vibration were tested in all extremities. Proprioception tested in the upper extremities was normal. Coordination: Rapid alternating movements in the fingers/hands was normal. Finger-to-nose maneuver  normal without evidence of ataxia, dysmetria or tremor. Gait and station: Patient walks without assistive device and is able unassisted to climb up to the exam table. Strength within normal limits.  Stance is stable and normal.  Deep tendon reflexes: in the  upper and lower extremities are symmetric and intact. Babinski maneuver response is downgoing.  The patient was advised of the nature of the diagnosed sleep disorder , the treatment options and risks for general a health and wellness arising from not treating the condition.  I spent more than 35 minutes of face to face time with the patient. Greater than 50% of time was spent in counseling and coordination of care. We have discussed the diagnosis and differential and I answered the patient's questions.      Assessment:  After physical and neurologic examination, review of laboratory studies,  Personal review of imaging studies, reports of other /same  Imaging studies ,  Results of polysomnography/ neurophysiology testing and pre-existing records as far as provided in visit., my assessment is   1) Mr. Doupe does not fit the usual category of high risk patients for obstructive sleep apnea, his neck size and body mass index as well as Mallampati are not high risk. He does have a tendency to mouth breathe because he tends to have sinusitis and nasal congestion. He is using and nasal gel to keep the nasal passage moist is using an Netty pot and Nasonex. He still has often a drippy nose and a chronic congested nasal passage.  2) atrial fibrillation and frequent nocturia of the main concerns leading to an evaluation for obstructive or central sleep apnea in this patient. He would not be a candidate for home sleep test. I will arrange for a in lab split-night study.  3) Co2 is not needed.       Asencion Partridge Ayaka Andes MD  04/23/2015  cc; Dr . Einar Gip,    CC: Jani Gravel, Md [address] [place], [place] [address]

## 2015-04-25 ENCOUNTER — Encounter: Payer: Self-pay | Admitting: *Deleted

## 2015-04-26 ENCOUNTER — Institutional Professional Consult (permissible substitution): Payer: Medicare Other | Admitting: Neurology

## 2015-04-27 DIAGNOSIS — R001 Bradycardia, unspecified: Secondary | ICD-10-CM | POA: Diagnosis not present

## 2015-04-27 DIAGNOSIS — R55 Syncope and collapse: Secondary | ICD-10-CM | POA: Diagnosis not present

## 2015-04-27 DIAGNOSIS — I480 Paroxysmal atrial fibrillation: Secondary | ICD-10-CM | POA: Diagnosis not present

## 2015-04-29 ENCOUNTER — Ambulatory Visit (INDEPENDENT_AMBULATORY_CARE_PROVIDER_SITE_OTHER): Payer: Medicare Other | Admitting: Neurology

## 2015-04-29 ENCOUNTER — Encounter: Payer: Self-pay | Admitting: *Deleted

## 2015-04-29 DIAGNOSIS — R0683 Snoring: Secondary | ICD-10-CM

## 2015-04-29 DIAGNOSIS — G4730 Sleep apnea, unspecified: Secondary | ICD-10-CM

## 2015-04-29 DIAGNOSIS — R351 Nocturia: Secondary | ICD-10-CM

## 2015-04-29 DIAGNOSIS — I480 Paroxysmal atrial fibrillation: Secondary | ICD-10-CM

## 2015-04-29 DIAGNOSIS — G4710 Hypersomnia, unspecified: Secondary | ICD-10-CM

## 2015-04-30 NOTE — Sleep Study (Signed)
Please see the scanned sleep study interpretation located in the Procedure tab within the Chart Review section. 

## 2015-05-03 ENCOUNTER — Telehealth: Payer: Self-pay

## 2015-05-03 DIAGNOSIS — G4733 Obstructive sleep apnea (adult) (pediatric): Secondary | ICD-10-CM

## 2015-05-03 NOTE — Telephone Encounter (Signed)
I called the pt to discuss sleep study results. No answer on either home or mobile. Left a message on mobile asking pt to call me back.

## 2015-05-10 NOTE — Telephone Encounter (Signed)
Spoke to pt's wife (per DPR). I advised pt's wife that pt's sleep study revealed sleep apnea and immediate treatment is advised. PAP therapy is indicated. Dr. Brett Fairy recommends proceeding with a PAP titration study to optimize therapy. If pt's oxygen does not respond to cpap therapy, oxygen may be titrated as well during study. I advised pt's wife that pt should avoid sleeping on his back and try to sleep on his side. Pt's wife is agreeable to the cpap titration study. Pt's wife knows that our sleep lab will call him to schedule the cpap titration. Pt's wife verbalized understanding of results and will have the pt call us if he has any questions.

## 2015-05-23 ENCOUNTER — Ambulatory Visit (INDEPENDENT_AMBULATORY_CARE_PROVIDER_SITE_OTHER): Payer: Medicare Other | Admitting: Neurology

## 2015-05-23 DIAGNOSIS — G4733 Obstructive sleep apnea (adult) (pediatric): Secondary | ICD-10-CM

## 2015-05-28 ENCOUNTER — Telehealth: Payer: Self-pay

## 2015-05-28 DIAGNOSIS — G4733 Obstructive sleep apnea (adult) (pediatric): Secondary | ICD-10-CM

## 2015-05-28 NOTE — Telephone Encounter (Signed)
Spoke to pt regarding his sleep study results. I advised him that his cpap titration study showed significant improvement with less respiratory events compared with the previous study and Dr. Brett Fairy recommends starting a cpap. Pt is agreeable to starting a cpap. I advised pt that I would send the order to a DME and they should contact him in about a week to get it set up. Pt verbalized understanding. I advised pt that there were some limb movements of sleep during REM noted. Treatment can be discussed at pt's follow up appt. Pt verbalized understanding. A follow up appt was made for 6/28 at 10:30. Pt verbalized understanding. Pt inquired about price of cpap, and I advised him that the DME would address the price of cpap with his insurance company. Pt verbalized understanding. Pt had no questions at this time but was encouraged to call back if questions arise.  Will send to [facility].

## 2015-05-29 DIAGNOSIS — G4730 Sleep apnea, unspecified: Secondary | ICD-10-CM | POA: Diagnosis not present

## 2015-05-29 DIAGNOSIS — I480 Paroxysmal atrial fibrillation: Secondary | ICD-10-CM | POA: Diagnosis not present

## 2015-05-29 DIAGNOSIS — R55 Syncope and collapse: Secondary | ICD-10-CM | POA: Diagnosis not present

## 2015-05-29 DIAGNOSIS — R001 Bradycardia, unspecified: Secondary | ICD-10-CM | POA: Diagnosis not present

## 2015-05-29 NOTE — Telephone Encounter (Signed)
Patient called back about getting his cpap machine.  He had forgotten he will get it from [place] but will now call them.

## 2015-06-05 DIAGNOSIS — G4733 Obstructive sleep apnea (adult) (pediatric): Secondary | ICD-10-CM | POA: Diagnosis not present

## 2015-07-06 DIAGNOSIS — G4733 Obstructive sleep apnea (adult) (pediatric): Secondary | ICD-10-CM | POA: Diagnosis not present

## 2015-07-19 ENCOUNTER — Encounter: Payer: Self-pay | Admitting: Internal Medicine

## 2015-07-19 DIAGNOSIS — R001 Bradycardia, unspecified: Secondary | ICD-10-CM | POA: Diagnosis not present

## 2015-07-19 DIAGNOSIS — I480 Paroxysmal atrial fibrillation: Secondary | ICD-10-CM | POA: Diagnosis not present

## 2015-07-19 DIAGNOSIS — R55 Syncope and collapse: Secondary | ICD-10-CM | POA: Diagnosis not present

## 2015-07-19 DIAGNOSIS — G4730 Sleep apnea, unspecified: Secondary | ICD-10-CM | POA: Diagnosis not present

## 2015-07-30 ENCOUNTER — Encounter: Payer: Self-pay | Admitting: Internal Medicine

## 2015-07-30 ENCOUNTER — Ambulatory Visit (INDEPENDENT_AMBULATORY_CARE_PROVIDER_SITE_OTHER): Payer: Medicare Other | Admitting: Internal Medicine

## 2015-07-30 VITALS — BP 132/70 | HR 50 | Ht 70.0 in | Wt 194.4 lb

## 2015-07-30 DIAGNOSIS — I4891 Unspecified atrial fibrillation: Secondary | ICD-10-CM

## 2015-07-30 DIAGNOSIS — I480 Paroxysmal atrial fibrillation: Secondary | ICD-10-CM | POA: Diagnosis not present

## 2015-07-30 DIAGNOSIS — R001 Bradycardia, unspecified: Secondary | ICD-10-CM | POA: Diagnosis not present

## 2015-07-30 NOTE — Progress Notes (Signed)
Electrophysiology Office Note   Date:  07/30/2015   ID:  Srithan, Bleazard December 29, 1940, MRN [id number]  PCP:  Jani Gravel, MD  Cardiologist:  Dr Einar Gip Primary Electrophysiologist: Thompson Grayer, MD    Chief Complaint  Patient presents with  . Atrial Fibrillation     History of Present Illness: Matthew Cannon is a 75 y.o. male who presents today for electrophysiology evaluation.   He reports having near syncope x 3 over the last 6 months of 2016.  He was standing with each episode.  He reports having abrupt sensation that he might pass out.  He had to drop to his knees to steady himself.  After 2-3 minutes he would return to baseline.  He was evaluated by Dr Einar Gip 2/17.  Workup has included event monitor which revealed afib with RVR.  EKGs have confirmed sinus bradycardia back to 2014. Stress test and echo were low risk.  He was started on flecainide and metoprolol but did not tolerate these due to fatigue.  He stopped flecainide and had some improvement.  He continues to take metoprolol.  He states that he still does not feel like "his old self".  He has occasional dizziness. He feels that prior to starting metoprolol and flecainide that he did not have exercise limitation.  He had been able to go on long backpacking trips without difficulty.  In 2012 he all of a sudden could not go up a hill with a backpack on.  He has not been back on these trips since that time.  [place] did go [place] and did fine.  Today, he denies symptoms of palpitations, chest pain, shortness of breath, orthopnea, PND, lower extremity edema, claudication,  bleeding, or neurologic sequela.  He has recently been diagnosed with OSA and has started CPAP.  The patient is tolerating medications without difficulties and is otherwise without complaint today.    Past Medical History  Diagnosis Date  . Hypertension   . Sinus bradycardia   . PAF (paroxysmal atrial fibrillation) ([facility])   .  Syncope   . Left atrial dilatation   . Sleep apnea     uses CPAP   Past Surgical History  Procedure Laterality Date  . Foot surgery      had bone spur  . Colonoscopy    . Vasectomy    . Inguinal hernia repair Bilateral 12/15/2012    Procedure: LAPAROSCOPIC BILATERAL INGUINAL HERNIA LEFT DIRECT AND INDIRECT HERNIA  AND RIGHT DIRECT HERNIA WITH MESH;  Surgeon: Gayland Curry, MD;  Location: [facility];  Service: General;  Laterality: Bilateral;  . Insertion of mesh Bilateral 12/15/2012    Procedure: INSERTION OF MESH BILATERAL;  Surgeon: Gayland Curry, MD;  Location: [facility];  Service: General;  Laterality: Bilateral;  INGUINAL  . Melanoma removal  2016     Current Outpatient Prescriptions  Medication Sig Dispense Refill  . metoprolol tartrate (LOPRESSOR) 25 MG tablet TK 1/2 T PO BID  0  . rivaroxaban (XARELTO) 20 MG TABS tablet Take 20 mg by mouth daily with supper.     No current facility-administered medications for this visit.    Allergies:   Zithromax   Social History:  The patient  reports that he quit smoking about 34 years ago. He has never used smokeless tobacco. He reports that he drinks alcohol. He reports that he does not use illicit drugs.   Family History:  The patient's  family history includes Alcohol  abuse in his father; Cancer in his daughter; Suicidality in his father.    ROS:  Please see the history of present illness.   All other systems are reviewed and negative.    PHYSICAL EXAM: VS:  BP 132/70 mmHg  Pulse 50  Ht 5\' 10"  (1.778 m)  Wt 194 lb 6.4 oz (88.179 kg)  BMI 27.89 kg/m2  SpO2 96% , BMI Body mass index is 27.89 kg/(m^2). GEN: Well nourished, well developed, in no acute distress HEENT: normal Neck: no JVD, carotid bruits, or masses Cardiac: RRR; no murmurs, rubs, or gallops,no edema  Respiratory:  clear to auscultation bilaterally, normal work of breathing GI: soft, nontender, nondistended, + BS MS: no deformity or atrophy Skin: warm and dry    Neuro:  Strength and sensation are intact Psych: euthymic mood, full affect  EKG:  EKG from 07/19/15 reveals sinus bradycardia 42 bpm, PR 173 msec, QRS 136 msec, otherwise normal ekg   Wt Readings from Last 3 Encounters:  07/30/15 194 lb 6.4 oz (88.179 kg)  04/23/15 186 lb (84.369 kg)  01/13/13 201 lb (91.173 kg)      Other studies Reviewed: Additional studies/ records that were reviewed today include:  12 pages of records from Dr Irven Shelling office  Review of the above records today demonstrates: echo shows EF 55%, LA size 48 mm, MVP with mild MR,  ETT 03/19/15 total exercise time was 9:09 min with achieved max heart rate of 148 bpm which was 102% of MPHR.  There were no st/t changes  Event monitor is reviewed  ASSESSMENT AND PLAN:  1.  Paroxysmal atrial fibrillation and atrial flutter  Asymptomatic Observed on recent event monitor (reviewed today from Dr Irven Shelling office). V rates are not significantly elevated Continue low dose metoprolol as long as he tolerates chads2vasc score is at least 3.  Continue xarelto  2. Sinus bradycardia Chronic  (seen on 2014 ekg) Previously asymptomatic Adequate heart rate response to exercise on ETT with Dr Einar Gip Not certain that he would benefit from pacing at this time.  We did discuss ppm as an option.  He would like to avoid if able  3. Presyncope Did not have full loss of consciousness Unclear etiology  We discussed implantable loop recorder placement to better determine if an arrhythmia was the cause.  He is clear that he would like to avoid this presently but may be more willing if further episodes occur  No changes today  Follow-up:  Return to see me in 3 months If stable at that time, I will return care in its entirety to Dr Einar Gip He should call my office if any problems arise in the interim  Current medicines are reviewed at length with the patient today.   The patient does not have concerns regarding his medicines.  The following  changes were made today:  none   Signed, Thompson Grayer, MD  07/30/2015 12:05 PM     [place] [address] [phone] (office) [phone] (fax)

## 2015-07-30 NOTE — Patient Instructions (Signed)

## 2015-08-01 ENCOUNTER — Ambulatory Visit: Payer: Self-pay | Admitting: Neurology

## 2015-08-05 DIAGNOSIS — G4733 Obstructive sleep apnea (adult) (pediatric): Secondary | ICD-10-CM | POA: Diagnosis not present

## 2015-08-09 DIAGNOSIS — R739 Hyperglycemia, unspecified: Secondary | ICD-10-CM | POA: Diagnosis not present

## 2015-08-09 DIAGNOSIS — I10 Essential (primary) hypertension: Secondary | ICD-10-CM | POA: Diagnosis not present

## 2015-08-20 ENCOUNTER — Ambulatory Visit (INDEPENDENT_AMBULATORY_CARE_PROVIDER_SITE_OTHER): Payer: Medicare Other | Admitting: Neurology

## 2015-08-20 ENCOUNTER — Encounter: Payer: Self-pay | Admitting: Neurology

## 2015-08-20 ENCOUNTER — Encounter: Payer: Self-pay | Admitting: Internal Medicine

## 2015-08-20 VITALS — BP 160/80 | HR 50 | Resp 20 | Ht 70.0 in | Wt 198.0 lb

## 2015-08-20 DIAGNOSIS — R739 Hyperglycemia, unspecified: Secondary | ICD-10-CM | POA: Diagnosis not present

## 2015-08-20 DIAGNOSIS — E785 Hyperlipidemia, unspecified: Secondary | ICD-10-CM | POA: Diagnosis not present

## 2015-08-20 DIAGNOSIS — I480 Paroxysmal atrial fibrillation: Secondary | ICD-10-CM | POA: Diagnosis not present

## 2015-08-20 DIAGNOSIS — G4733 Obstructive sleep apnea (adult) (pediatric): Secondary | ICD-10-CM

## 2015-08-20 DIAGNOSIS — Z9989 Dependence on other enabling machines and devices: Principal | ICD-10-CM

## 2015-08-20 DIAGNOSIS — I10 Essential (primary) hypertension: Secondary | ICD-10-CM | POA: Diagnosis not present

## 2015-08-20 DIAGNOSIS — Z0001 Encounter for general adult medical examination with abnormal findings: Secondary | ICD-10-CM | POA: Diagnosis not present

## 2015-08-20 NOTE — Progress Notes (Signed)
SLEEP MEDICINE CLINIC   Provider:  Larey Seat, M D  Referring Provider: Jani Gravel, MD Primary Care Physician:  Jani Gravel, MD  Chief Complaint  Patient presents with  . Follow-up    feels better on cpap    HPI:  Matthew Cannon is a 75 y.o. male , seen here as a referral from Dr. Einar Gip for a sleep evaluation, to rule out in the setting of atrial fibrillation.   The patient had undergone a 30 day cardiac monitor , which captured 4 atrial fib attacks, 3 in sleep. He started on 3 medications , which he doesn't tolerate well. He feels sick and clammy on the medications. He has chronic rhinitis and sinusitis.  Mr. Matthew Cannon has undergone 2 Mohs surgeries, one on the left neck just below the lower jaw and one on the high forehead. This will be important should CPAP treatment be indicated after our evaluation is completed.  [name] medical records speak of a cardiac workup following syncope spells there were 3 near syncopal episodes in October of November 2016 the first one occurred while he was still working in the heat outside and he may have been dehydrated or overheated, the second and third Near-syncope occurred inside without any exertion. The symptoms lasted each time between 4 and 5 minutes or even less. He had no chest pain or tightness and no shortness of breath associated Dr. Glennis Brink workup included an event monitor and echocardiogram and a cardiac stress test he continued to have intermittent episodes of dizziness.  His current medications include Xarelto, lisinopril he is now also on flecainide and metoprolol. The patient has a very remote smoking history of one pack per day until 1983. He is a modest alcohol drinker with 3 drinks per week and does not use caffeine aged beverages.  Sleep habits are as follows: The patient usually goes to bed between 10 and 11 PM, he is currently using one small pillow but used multiple pillows as a head rest before. He prefers the  supine sleep position, the bedroom is cool, quiet and dark. He has nocturia 4-6 times each night, his sinus blockage makes breathing difficulties and he snores.  He rises at 7 AM , often already awake after 5 AM. He doesn't wake with or from headaches and he is usually well refreshed. No alarm needed. He feels not restless   Interval history from 08/20/2015. Mr. Matthew Cannon, who was referred here by his cardiologist, underwent a sleep study on 04/29/2015 and was diagnosed with an AHI of 37.6, and supine AHI of 57.7. His nadir was 83% was 61 minutes of desaturation. The study was followed by a CPAP titration and the patient did respond well to 8 cm water pressure. I prescribed an auto CPAP between 5 and 10 with a full face mask. Mr. Townsend brought me his compliance report today he was 100% compliance for the last 30 days with an average user time of 7 hours and 43 minutes, the AutoSet is indeed set between 5 and 10 cm water with 3 cm EPR, the  95th percentile pressure is 8.6 cm  and his AHI is 1.6. The have to be no adjustments made.   Sleep medical history and family sleep history:None known.  Social history: married, retired, 2 sisters, 3 adult children. Non smoker , 3 glasses of ETOH per week.   Review of Systems:  Nocturia from  3-4 at night to 1 bathroom break on CPAP, no longer night sweats,  Sounder sleep,  4 hours of uninterrupted sleep- "I don't go to bed without that thing on my face " . 7-9 hours of sleep. Less atrial fibrillation?   Out of a complete 14 system review, the patient complains of only the following symptoms, and all other reviewed systems are negative. How likely are you to doze in the following situations: 0 = not likely, 1 = slight chance, 2 = moderate chance, 3 = high chance  Sitting and Reading? Watching Television? Sitting inactive in a public place (theater or meeting)? Lying down in the afternoon when circumstances permit? Sitting and talking to  someone? Sitting quietly after lunch without alcohol? In a car, while stopped for a few minutes in traffic? As a passenger in a car for an hour without a break?  Total = 10  Fatigue severity score 23  , depression score n/a    Social History   Social History  . Marital Status: Married    Spouse Name: N/A  . Number of Children: N/A  . Years of Education: N/A   Occupational History  . Not on file.   Social History Main Topics  . Smoking status: Former Smoker    Quit date: 03/06/1981  . Smokeless tobacco: Never Used  . Alcohol Use: 0.0 oz/week    0 Standard drinks or equivalent per week     Comment: 3 x week, 1-2 wine or a beer, on Friday nights a Gin Martini  . Drug Use: No  . Sexual Activity: Not on file   Other Topics Concern  . Not on file   Social History Narrative   Lives in [place] with spouse.  Retired from Radiation protection practitioner       Family History  Problem Relation Age of Onset  . Cancer Daughter     breast  . Suicidality Father   . Alcohol abuse Father     Past Medical History  Diagnosis Date  . Hypertension   . Sinus bradycardia   . PAF (paroxysmal atrial fibrillation) ([place])   . Syncope   . Left atrial dilatation   . Sleep apnea     uses CPAP    Past Surgical History  Procedure Laterality Date  . Foot surgery      had bone spur  . Colonoscopy    . Vasectomy    . Inguinal hernia repair Bilateral 12/15/2012    Procedure: LAPAROSCOPIC BILATERAL INGUINAL HERNIA LEFT DIRECT AND INDIRECT HERNIA  AND RIGHT DIRECT HERNIA WITH MESH;  Surgeon: Gayland Curry, MD;  Location: [facility];  Service: General;  Laterality: Bilateral;  . Insertion of mesh Bilateral 12/15/2012    Procedure: INSERTION OF MESH BILATERAL;  Surgeon: Gayland Curry, MD;  Location: [facility];  Service: General;  Laterality: Bilateral;  INGUINAL  . Melanoma removal  2016    Current Outpatient Prescriptions  Medication Sig Dispense Refill  . metoprolol tartrate (LOPRESSOR) 25 MG tablet TK 1/2  T PO BID  0  . rivaroxaban (XARELTO) 20 MG TABS tablet Take 20 mg by mouth daily with supper.     No current facility-administered medications for this visit.    Allergies as of 08/20/2015 - Review Complete 08/20/2015  Allergen Reaction Noted  . Zithromax [azithromycin] Diarrhea 06/11/2012    Vitals: BP 160/80 mmHg  Pulse 50  Resp 20  Ht 5\' 10"  (1.778 m)  Wt 198 lb (89.812 kg)  BMI 28.41 kg/m2 Last Weight:  Wt Readings from Last 1 Encounters:  08/20/15 198 lb (89.812  kg)   [id number] mass index is 28.41 kg/(m^2).     Last Height:   Ht Readings from Last 1 Encounters:  08/20/15 5\' 10"  (1.778 m)    Physical exam:  General: The patient is awake, alert and appears not in acute distress. The patient is well groomed. Head: Normocephalic, atraumatic. Neck is supple. Mallampati 2, deviated to the left. ,  neck circumference: 16.5 . Nasal airflow restricted ,Retrognathia is not seen.  Cardiovascular:  Regular rate and rhythm, without  murmurs or carotid bruit, and without distended neck veins. Respiratory: Lungs are clear to auscultation. Skin:  Without evidence of edema, or rash Trunk: BMI is normal . The patient's posture is erect  Neurologic exam : The patient is awake and alert, oriented to place and time.   Memory subjective  described as intact.  Attention span & concentration ability appears normal.  Speech is fluent,  with dysphonia , not  aphasia.  Mood and affect are appropriate. The patient was advised of the nature of the diagnosed sleep disorder , the treatment options and risks for general a health and wellness arising from not treating the condition.  I spent more than 15 minutes of face to face time with the patient. Greater than 50% of time was spent in counseling and coordination of care. We have discussed the diagnosis and differential and I answered the patient's questions.     Assessment:  After physical and neurologic examination, review of laboratory studies,   Personal review of imaging studies, reports of other /same  Imaging studies ,  Results of polysomnography/ neurophysiology testing and pre-existing records as far as provided in visit., my assessment is   1) Mr. Unrath does not fit the usual category of high risk patients for obstructive sleep apnea, his neck size and body mass index as well as Mallampati are not high risk. He does have a tendency to mouth breathe because he tends to have sinusitis and nasal congestion. He is using and nasal gel to keep the nasal passage moist is using an Netty pot and Nasonex. He still has often a drippy nose and a chronic congested nasal passage. I was very surprised to find Mr. Mazzoli degree of apnea, also it was supine dependent. He has done very well with his auto CPAP between 5 and 10 cm and reduction of the AHI by over 90%. I like them to continues the use of the current settings and his interface, a full face mask he chose. I will see him again in one year.  2) atrial fibrillation and frequent nocturia of the main concerns leading to an evaluation for obstructive or central sleep apnea in this patient. He would not be a candidate for home sleep test. I will arrange for a in lab split-night study.  I have asked Dr. Einar Gip for the referral of this pleasant patient and hope that Mr. Bateson may experience less lightheadedness spells  With his heart rate control improving along with  apnea. Sincerely C. Dajanique Robley M.D.     Asencion Partridge Svara Twyman MD  08/20/2015  cc; Dr . Einar Gip,    CC: Jani Gravel, Md [address] [place], [place] [address]

## 2015-09-05 DIAGNOSIS — G4733 Obstructive sleep apnea (adult) (pediatric): Secondary | ICD-10-CM | POA: Diagnosis not present

## 2015-09-11 ENCOUNTER — Encounter: Payer: Self-pay | Admitting: *Deleted

## 2015-09-25 DIAGNOSIS — D1801 Hemangioma of skin and subcutaneous tissue: Secondary | ICD-10-CM | POA: Diagnosis not present

## 2015-09-25 DIAGNOSIS — L814 Other melanin hyperpigmentation: Secondary | ICD-10-CM | POA: Diagnosis not present

## 2015-09-25 DIAGNOSIS — L570 Actinic keratosis: Secondary | ICD-10-CM | POA: Diagnosis not present

## 2015-09-25 DIAGNOSIS — D225 Melanocytic nevi of trunk: Secondary | ICD-10-CM | POA: Diagnosis not present

## 2015-09-25 DIAGNOSIS — C44612 Basal cell carcinoma of skin of right upper limb, including shoulder: Secondary | ICD-10-CM | POA: Diagnosis not present

## 2015-09-25 DIAGNOSIS — Z85820 Personal history of malignant melanoma of skin: Secondary | ICD-10-CM | POA: Diagnosis not present

## 2015-09-25 DIAGNOSIS — L821 Other seborrheic keratosis: Secondary | ICD-10-CM | POA: Diagnosis not present

## 2015-10-24 ENCOUNTER — Ambulatory Visit: Payer: Medicare Other | Admitting: Internal Medicine

## 2015-10-31 ENCOUNTER — Ambulatory Visit: Payer: Medicare Other | Admitting: Internal Medicine

## 2015-11-19 ENCOUNTER — Ambulatory Visit (INDEPENDENT_AMBULATORY_CARE_PROVIDER_SITE_OTHER): Payer: Medicare Other | Admitting: Internal Medicine

## 2015-11-19 ENCOUNTER — Encounter: Payer: Self-pay | Admitting: Internal Medicine

## 2015-11-19 VITALS — BP 142/82 | HR 42 | Ht 70.0 in | Wt 197.0 lb

## 2015-11-19 DIAGNOSIS — I480 Paroxysmal atrial fibrillation: Secondary | ICD-10-CM | POA: Diagnosis not present

## 2015-11-19 DIAGNOSIS — Z23 Encounter for immunization: Secondary | ICD-10-CM | POA: Diagnosis not present

## 2015-11-19 DIAGNOSIS — I495 Sick sinus syndrome: Secondary | ICD-10-CM | POA: Diagnosis not present

## 2015-11-19 NOTE — Patient Instructions (Signed)
Medication Instructions:  Your physician recommends that you continue on your current medications as directed. Please refer to the Current Medication list given to you today.  Labwork: None ordered.  Testing/Procedures: None ordered.  Follow-Up: Your physician recommends that you schedule a follow-up appointment as needed.   Any Other Special Instructions Will Be Listed Below (If Applicable).     If you need a refill on your cardiac medications before your next appointment, please call your pharmacy.   

## 2015-11-20 NOTE — Progress Notes (Signed)
Electrophysiology Office Note   Date:  11/20/2015   ID:  Montray, Boccuzzi July 13, 1940, MRN [id number]  PCP:  Jani Gravel, MD  Cardiologist:  Dr Einar Gip Primary Electrophysiologist: Thompson Grayer, MD    Chief Complaint  Patient presents with  . Atrial Fibrillation     History of Present Illness: Matthew Cannon is a 75 y.o. male who presents today for electrophysiology follow-up.   He has done well since his last visit.  Denies any syncope or symptoms of bradycardia.  He is unaware of any further afib.  Today, he denies symptoms of palpitations, chest pain, shortness of breath, orthopnea, PND, lower extremity edema, claudication,  bleeding, or neurologic sequela.  He has recently been diagnosed with OSA and has started CPAP.  The patient is tolerating medications without difficulties and is otherwise without complaint today.    Past Medical History:  Diagnosis Date  . Hypertension   . Left atrial dilatation   . PAF (paroxysmal atrial fibrillation) ([place])   . Sinus bradycardia   . Sleep apnea    uses CPAP  . Syncope    Past Surgical History:  Procedure Laterality Date  . COLONOSCOPY    . FOOT SURGERY     had bone spur  . INGUINAL HERNIA REPAIR Bilateral 12/15/2012   Procedure: LAPAROSCOPIC BILATERAL INGUINAL HERNIA LEFT DIRECT AND INDIRECT HERNIA  AND RIGHT DIRECT HERNIA WITH MESH;  Surgeon: Gayland Curry, MD;  Location: [facility];  Service: General;  Laterality: Bilateral;  . INSERTION OF MESH Bilateral 12/15/2012   Procedure: INSERTION OF MESH BILATERAL;  Surgeon: Gayland Curry, MD;  Location: [facility];  Service: General;  Laterality: Bilateral;  INGUINAL  . melanoma removal  2016  . VASECTOMY       Current Outpatient Prescriptions  Medication Sig Dispense Refill  . metoprolol tartrate (LOPRESSOR) 25 MG tablet Take 1/2 tablet by mouth twice a day  0  . rivaroxaban (XARELTO) 20 MG TABS tablet Take 20 mg by mouth daily with supper.     No current  facility-administered medications for this visit.     Allergies:   Zithromax [azithromycin]   Social History:  The patient  reports that he quit smoking about 34 years ago. He has never used smokeless tobacco. He reports that he drinks alcohol. He reports that he does not use drugs.   Family History:  The patient's  family history includes Alcohol abuse in his father; Cancer in his daughter; Suicidality in his father.    ROS:  Please see the history of present illness.   All other systems are reviewed and negative.    PHYSICAL EXAM: VS:  BP (!) 142/82   Pulse (!) 42   Ht 5\' 10"  (1.778 m)   Wt 197 lb (89.4 kg)   BMI 28.27 kg/m  , BMI Body mass index is 28.27 kg/m. GEN: Well nourished, well developed, in no acute distress  HEENT: normal  Neck: no JVD, carotid bruits, or masses Cardiac: bradycardic regular rhythm; no murmurs, rubs, or gallops,no edema  Respiratory:  clear to auscultation bilaterally, normal work of breathing GI: soft, nontender, nondistended, + BS MS: no deformity or atrophy  Skin: warm and dry  Neuro:  Strength and sensation are intact Psych: euthymic mood, full affect  EKG:  EKG from 07/19/15 reveals sinus bradycardia 42 bpm    Wt Readings from Last 3 Encounters:  11/19/15 197 lb (89.4 kg)  08/20/15 198 lb (89.8 kg)  07/30/15 194 lb 6.4  oz (88.2 kg)      ASSESSMENT AND PLAN:  1.  Paroxysmal atrial fibrillation and atrial flutter  Asymptomatic Observed on recent event monitor (reviewed today from Dr Irven Shelling office). Continue low dose metoprolol as long as he tolerates chads2vasc score is at least 3.  Continue xarelto  2. Sinus bradycardia Chronic  (seen on 2014 ekg) Previously asymptomatic I had a long discussion with the patient about pacemaker implantation.  I have strongly advised that he consider PPM implant as I believe that he would have some improvement in symptoms of bradycardia with pacing.  Specifically, dizziness and presyncope may improve.   We discussed ILR placement, however I have discouraged this.  I think that if he decides to proceed that a PPM would be a better option.  He wishes to follow-up with Dr Einar Gip.  If he decides to proceed with PPM in the future then he will contact my office.  Current medicines are reviewed at length with the patient today.   The patient does not have concerns regarding his medicines.  The following changes were made today:  none   Signed, Thompson Grayer, MD   [place] [address] [phone] (office) [phone] (fax)

## 2015-12-05 DIAGNOSIS — L820 Inflamed seborrheic keratosis: Secondary | ICD-10-CM | POA: Diagnosis not present

## 2015-12-05 DIAGNOSIS — Z85828 Personal history of other malignant neoplasm of skin: Secondary | ICD-10-CM | POA: Diagnosis not present

## 2016-01-17 DIAGNOSIS — G4730 Sleep apnea, unspecified: Secondary | ICD-10-CM | POA: Diagnosis not present

## 2016-01-17 DIAGNOSIS — I480 Paroxysmal atrial fibrillation: Secondary | ICD-10-CM | POA: Diagnosis not present

## 2016-01-17 DIAGNOSIS — R001 Bradycardia, unspecified: Secondary | ICD-10-CM | POA: Diagnosis not present

## 2016-01-17 DIAGNOSIS — R55 Syncope and collapse: Secondary | ICD-10-CM | POA: Diagnosis not present

## 2016-02-07 DIAGNOSIS — H2513 Age-related nuclear cataract, bilateral: Secondary | ICD-10-CM | POA: Diagnosis not present

## 2016-02-07 DIAGNOSIS — G43809 Other migraine, not intractable, without status migrainosus: Secondary | ICD-10-CM | POA: Diagnosis not present

## 2016-02-07 DIAGNOSIS — H35033 Hypertensive retinopathy, bilateral: Secondary | ICD-10-CM | POA: Diagnosis not present

## 2016-02-07 DIAGNOSIS — H25013 Cortical age-related cataract, bilateral: Secondary | ICD-10-CM | POA: Diagnosis not present

## 2016-03-18 DIAGNOSIS — R739 Hyperglycemia, unspecified: Secondary | ICD-10-CM | POA: Diagnosis not present

## 2016-03-18 DIAGNOSIS — I480 Paroxysmal atrial fibrillation: Secondary | ICD-10-CM | POA: Diagnosis not present

## 2016-03-18 DIAGNOSIS — I10 Essential (primary) hypertension: Secondary | ICD-10-CM | POA: Diagnosis not present

## 2016-03-25 DIAGNOSIS — I10 Essential (primary) hypertension: Secondary | ICD-10-CM | POA: Diagnosis not present

## 2016-03-25 DIAGNOSIS — R739 Hyperglycemia, unspecified: Secondary | ICD-10-CM | POA: Diagnosis not present

## 2016-03-25 DIAGNOSIS — Z0001 Encounter for general adult medical examination with abnormal findings: Secondary | ICD-10-CM | POA: Diagnosis not present

## 2016-03-25 DIAGNOSIS — E784 Other hyperlipidemia: Secondary | ICD-10-CM | POA: Diagnosis not present

## 2016-04-03 DIAGNOSIS — R05 Cough: Secondary | ICD-10-CM | POA: Diagnosis not present

## 2016-07-14 DIAGNOSIS — H10811 Pingueculitis, right eye: Secondary | ICD-10-CM | POA: Diagnosis not present

## 2016-07-24 DIAGNOSIS — I480 Paroxysmal atrial fibrillation: Secondary | ICD-10-CM | POA: Diagnosis not present

## 2016-07-24 DIAGNOSIS — G4730 Sleep apnea, unspecified: Secondary | ICD-10-CM | POA: Diagnosis not present

## 2016-07-24 DIAGNOSIS — R001 Bradycardia, unspecified: Secondary | ICD-10-CM | POA: Diagnosis not present

## 2016-07-24 DIAGNOSIS — R55 Syncope and collapse: Secondary | ICD-10-CM | POA: Diagnosis not present

## 2016-07-28 DIAGNOSIS — G4733 Obstructive sleep apnea (adult) (pediatric): Secondary | ICD-10-CM | POA: Diagnosis not present

## 2016-08-25 ENCOUNTER — Ambulatory Visit: Payer: Medicare Other | Admitting: Adult Health

## 2016-09-01 ENCOUNTER — Ambulatory Visit (INDEPENDENT_AMBULATORY_CARE_PROVIDER_SITE_OTHER): Payer: Medicare Other | Admitting: Internal Medicine

## 2016-09-01 ENCOUNTER — Encounter: Payer: Self-pay | Admitting: Internal Medicine

## 2016-09-01 VITALS — BP 128/70 | HR 50 | Ht 70.0 in | Wt 205.0 lb

## 2016-09-01 DIAGNOSIS — I480 Paroxysmal atrial fibrillation: Secondary | ICD-10-CM | POA: Diagnosis not present

## 2016-09-01 DIAGNOSIS — R001 Bradycardia, unspecified: Secondary | ICD-10-CM | POA: Diagnosis not present

## 2016-09-01 MED ORDER — METOPROLOL TARTRATE 25 MG PO TABS: 25.0000 mg | ORAL_TABLET | Freq: Two times a day (BID) | ORAL | 3 refills | Status: DC | PRN

## 2016-09-01 NOTE — Patient Instructions (Signed)
Medication Instructions:  Your physician has recommended you make the following change in your medication:  1) Take your Metoprolol 25 mg twice daily as needed   Labwork: None ordered   Testing/Procedures: None ordered   Follow-Up: Your physician recommends that you schedule a follow-up appointment as needed   Any Other Special Instructions Will Be Listed Below (If Applicable).     If you need a refill on your cardiac medications before your next appointment, please call your pharmacy.

## 2016-09-01 NOTE — Progress Notes (Signed)
   PCP: Jani Gravel, MD Primary Cardiologist: Dr Nile Riggs I Guess is a 76 y.o. male who presents today for routine electrophysiology followup.  Dr Einar Gip has asked for reconsultation regarding sinus bradycardia and afib.  Since last being seen in our clinic, the patient reports doing very well.  He remains active.  He denies symptoms of bradycardia.  He is on xarelto without symptoms or bleeding.  He is unaware of any afib episodes. Today, he denies symptoms of palpitations, chest pain, shortness of breath,  lower extremity edema, dizziness, presyncope, or syncope.  The patient is otherwise without complaint today.   Past Medical History:  Diagnosis Date  . Hypertension   . Left atrial dilatation   . PAF (paroxysmal atrial fibrillation) ([place])   . Sinus bradycardia   . Sleep apnea    uses CPAP  . Syncope    Past Surgical History:  Procedure Laterality Date  . COLONOSCOPY    . FOOT SURGERY     had bone spur  . INGUINAL HERNIA REPAIR Bilateral 12/15/2012   Procedure: LAPAROSCOPIC BILATERAL INGUINAL HERNIA LEFT DIRECT AND INDIRECT HERNIA  AND RIGHT DIRECT HERNIA WITH MESH;  Surgeon: Gayland Curry, MD;  Location: [facility];  Service: General;  Laterality: Bilateral;  . INSERTION OF MESH Bilateral 12/15/2012   Procedure: INSERTION OF MESH BILATERAL;  Surgeon: Gayland Curry, MD;  Location: [facility];  Service: General;  Laterality: Bilateral;  INGUINAL  . melanoma removal  2016  . VASECTOMY      ROS- all systems are reviewed and negatives except as per HPI above  Current Outpatient Prescriptions  Medication Sig Dispense Refill  . rivaroxaban (XARELTO) 20 MG TABS tablet Take 20 mg by mouth daily with supper.    . metoprolol tartrate (LOPRESSOR) 25 MG tablet Take 1 tablet (25 mg total) by mouth 2 (two) times daily as needed (Palpitations). 180 tablet 3   No current facility-administered medications for this visit.     Physical Exam: Vitals:   09/01/16 0821  BP: 128/70  Pulse: (!)  50  SpO2: 96%  Weight: 205 lb (93 kg)  Height: 5\' 10"  (1.778 m)    GEN- The patient is well appearing, alert and oriented x 3 today.   Head- normocephalic, atraumatic Eyes-  Sclera clear, conjunctiva pink Ears- hearing intact Oropharynx- clear Lungs- Clear to ausculation bilaterally, normal work of breathing Heart- bradycardic regular rhythm GI- soft, NT, ND, + BS Extremities- no clubbing, cyanosis, or edema  EKG tracing ordered today is personally reviewed and shows sinus rhythm 50 bpm, otherwise normal ekg  Assessment and Plan:  1. Sinus bradycardia Chronic Minimally symptomatic He is clear that he wishes to avoid pacing at this time.  I think this is very reasonable Given asymptomatic and infrequent afib, will change metoprolol to prn  2. Paroxysmal atrial fibrillation and atrial flutter Asymptomatic Infrequent He should take metoprolol only as needed He is clear that he would prefer a conservative approach chads2vasc score is 3.  He is tolerating xarelto without issue. We discussed pros and cons, risks and benefits to Watchman LAAO device.  Given early experience with this first generation device, he is clear that he would prefer to continue medical therapy at this time.  No changes are advised currently Per his preference, a conservative approach is advised Return as needed  Thompson Grayer MD, [facility] 09/01/2016 5:24 PM

## 2016-09-04 DIAGNOSIS — G4730 Sleep apnea, unspecified: Secondary | ICD-10-CM | POA: Diagnosis not present

## 2016-09-04 DIAGNOSIS — R001 Bradycardia, unspecified: Secondary | ICD-10-CM | POA: Diagnosis not present

## 2016-09-04 DIAGNOSIS — I480 Paroxysmal atrial fibrillation: Secondary | ICD-10-CM | POA: Diagnosis not present

## 2016-09-04 DIAGNOSIS — R55 Syncope and collapse: Secondary | ICD-10-CM | POA: Diagnosis not present

## 2016-09-15 DIAGNOSIS — E784 Other hyperlipidemia: Secondary | ICD-10-CM | POA: Diagnosis not present

## 2016-09-15 DIAGNOSIS — R739 Hyperglycemia, unspecified: Secondary | ICD-10-CM | POA: Diagnosis not present

## 2016-09-15 DIAGNOSIS — I10 Essential (primary) hypertension: Secondary | ICD-10-CM | POA: Diagnosis not present

## 2016-09-22 DIAGNOSIS — R739 Hyperglycemia, unspecified: Secondary | ICD-10-CM | POA: Diagnosis not present

## 2016-09-22 DIAGNOSIS — Z0000 Encounter for general adult medical examination without abnormal findings: Secondary | ICD-10-CM | POA: Diagnosis not present

## 2016-09-22 DIAGNOSIS — Z125 Encounter for screening for malignant neoplasm of prostate: Secondary | ICD-10-CM | POA: Diagnosis not present

## 2016-09-22 DIAGNOSIS — I10 Essential (primary) hypertension: Secondary | ICD-10-CM | POA: Diagnosis not present

## 2016-10-21 DIAGNOSIS — Z23 Encounter for immunization: Secondary | ICD-10-CM | POA: Diagnosis not present

## 2016-10-27 ENCOUNTER — Encounter: Payer: Self-pay | Admitting: Neurology

## 2016-10-28 ENCOUNTER — Ambulatory Visit (INDEPENDENT_AMBULATORY_CARE_PROVIDER_SITE_OTHER): Payer: Medicare Other | Admitting: Adult Health

## 2016-10-28 ENCOUNTER — Encounter: Payer: Self-pay | Admitting: Adult Health

## 2016-10-28 VITALS — BP 135/72 | HR 60 | Wt 200.8 lb

## 2016-10-28 DIAGNOSIS — Z9989 Dependence on other enabling machines and devices: Secondary | ICD-10-CM | POA: Diagnosis not present

## 2016-10-28 DIAGNOSIS — G4733 Obstructive sleep apnea (adult) (pediatric): Secondary | ICD-10-CM

## 2016-10-28 NOTE — Patient Instructions (Signed)
Your Plan:  Continue using CPAP nightly If your symptoms worsen or you develop new symptoms please let us know.   Thank you for coming to see us at [facility]. I hope we have been able to provide you high quality care today.  You may receive a patient satisfaction survey over the next few weeks. We would appreciate your feedback and comments so that we may continue to improve ourselves and the health of our patients.  

## 2016-10-28 NOTE — Progress Notes (Signed)
I agree with the assessment and plan as directed by NP .The patient is known to me .   Aristotle Lieb, MD  

## 2016-10-28 NOTE — Progress Notes (Signed)
PATIENT: Matthew Cannon DOB: [birth year]  REASON FOR VISIT: follow up- obstructive sleep apnea on CPAP HISTORY FROM: patient  HISTORY OF PRESENT ILLNESS: Today 10/28/16: Matthew Cannon is a 76 year old male with a history of obstructive sleep apnea on CPAP. He returns today for a compliance download. His download indicates that he uses machine 30 out of 30 days for compliance of 100%. He uses machine greater than 4 hours each night. On average he uses his machine 7 hours and 38 minutes. His minimum pressure is 5 cm water and maximum pressure 10 cm of water. His residual AHI is 1.8 he does not have a significant leak. He reports that the CPAP is working well. He does see the benefit. He states that he had to turn his humidification up and it created a popping sound in the machine. He denies any new neurological symptoms. He returns today for an evaluation.  HISTORY Copied from dr. Edwena Felty notes: HPI:  Matthew Cannon is a 76 y.o. male , seen here as a referral from Dr. Einar Gip for a sleep evaluation, to rule out in the setting of atrial fibrillation.   The patient had undergone a 30 day cardiac monitor , which captured 4 atrial fib attacks, 3 in sleep. He started on 3 medications , which he doesn't tolerate well. He feels sick and clammy on the medications. He has chronic rhinitis and sinusitis.  Matthew Cannon has undergone 2 Mohs surgeries, one on the left neck just below the lower jaw and one on the high forehead. This will be important should CPAP treatment be indicated after our evaluation is completed.  Matthew Cannon medical records speak of a cardiac workup following syncope spells there were 3 near syncopal episodes in October of November 2016 the first one occurred while he was still working in the heat outside and he may have been dehydrated or overheated, the second and third Near-syncope occurred inside without any exertion. The symptoms lasted each time between 4 and 5  minutes or even less. He had no chest pain or tightness and no shortness of breath associated Dr. Glennis Brink workup included an event monitor and echocardiogram and a cardiac stress test he continued to have intermittent episodes of dizziness.  His current medications include Xarelto, lisinopril he is now also on flecainide and metoprolol. The patient has a very remote smoking history of one pack per day until 1983. He is a modest alcohol drinker with 3 drinks per week and does not use caffeine aged beverages.  Sleep habits are as follows: The patient usually goes to bed between 10 and 11 PM, he is currently using one small pillow but used multiple pillows as a head rest before. He prefers the supine sleep position, the bedroom is cool, quiet and dark. He has nocturia 4-6 times each night, his sinus blockage makes breathing difficulties and he snores.  He rises at 7 AM , often already awake after 5 AM. He doesn't wake with or from headaches and he is usually well refreshed. No alarm needed. He feels not restless   Interval history from 08/20/2015. Matthew Cannon, who was referred here by his cardiologist, underwent a sleep study on 04/29/2015 and was diagnosed with an AHI of 37.6, and supine AHI of 57.7. His nadir was 83% was 61 minutes of desaturation. The study was followed by a CPAP titration and the patient did respond well to 8 cm water pressure. I prescribed an auto CPAP between 5 and 10 with a  full face mask. Matthew Cannon brought me his compliance report today he was 100% compliance for the last 30 days with an average user time of 7 hours and 43 minutes, the AutoSet is indeed set between 5 and 10 cm water with 3 cm EPR, the  95th percentile pressure is 8.6 cm  and his AHI is 1.6. The have to be no adjustments made.   REVIEW OF SYSTEMS: Out of a complete 14 system review of symptoms, the patient complains only of the following symptoms, and all other reviewed systems are negative.  See  history of present illness  ALLERGIES: Allergies  Allergen Reactions  . Zithromax [Azithromycin] Diarrhea    HOME MEDICATIONS: Outpatient Medications Prior to Visit  Medication Sig Dispense Refill  . metoprolol tartrate (LOPRESSOR) 25 MG tablet Take 1 tablet (25 mg total) by mouth 2 (two) times daily as needed (Palpitations). 180 tablet 3  . rivaroxaban (XARELTO) 20 MG TABS tablet Take 20 mg by mouth daily with supper.     No facility-administered medications prior to visit.     PAST MEDICAL HISTORY: Past Medical History:  Diagnosis Date  . Hypertension   . Left atrial dilatation   . PAF (paroxysmal atrial fibrillation) ([place])   . Sinus bradycardia   . Sleep apnea    uses CPAP  . Syncope     PAST SURGICAL HISTORY: Past Surgical History:  Procedure Laterality Date  . COLONOSCOPY    . FOOT SURGERY     had bone spur  . INGUINAL HERNIA REPAIR Bilateral 12/15/2012   Procedure: LAPAROSCOPIC BILATERAL INGUINAL HERNIA LEFT DIRECT AND INDIRECT HERNIA  AND RIGHT DIRECT HERNIA WITH MESH;  Surgeon: Gayland Curry, MD;  Location: [facility];  Service: General;  Laterality: Bilateral;  . INSERTION OF MESH Bilateral 12/15/2012   Procedure: INSERTION OF MESH BILATERAL;  Surgeon: Gayland Curry, MD;  Location: [facility];  Service: General;  Laterality: Bilateral;  INGUINAL  . melanoma removal  2016  . VASECTOMY      FAMILY HISTORY: Family History  Problem Relation Age of Onset  . Suicidality Father   . Alcohol abuse Father   . Cancer Daughter        breast    SOCIAL HISTORY: Social History   Social History  . Marital status: Married    Spouse name: N/A  . Number of children: N/A  . Years of education: N/A   Occupational History  . Not on file.   Social History Main Topics  . Smoking status: Former Smoker    Quit date: 03/06/1981  . Smokeless tobacco: Never Used  . Alcohol use 0.0 oz/week     Comment: 3 x week, 1-2 wine or a beer, on Friday nights a Gin Martini  . Drug use: No   . Sexual activity: Not on file   Other Topics Concern  . Not on file   Social History Narrative   Lives in [place] with spouse.  Retired from Radiation protection practitioner         PHYSICAL EXAM  Vitals:   10/28/16 0816  BP: 135/72  Pulse: 60  Weight: 200 lb 12.8 oz (91.1 kg)   Body mass index is 28.81 kg/m.  Generalized: Well developed, in no acute distress   Neurological examination  Mentation: Alert oriented to time, place, history taking. Follows all commands speech and language fluent Cranial nerve II-XII: Pupils were equal round reactive to light. Extraocular movements were full, visual field were full on confrontational test. Facial sensation  and strength were normal. Uvula tongue midline. Head turning and shoulder shrug  were normal and symmetric.Neck circumference 16 inches, Mallampati 2+ Motor: The motor testing reveals 5 over 5 strength of all 4 extremities. Good symmetric motor tone is noted throughout.  Sensory: Sensory testing is intact to soft touch on all 4 extremities. No evidence of extinction is noted.  Gait and station: Gait is normal.    DIAGNOSTIC DATA (LABS, IMAGING, TESTING) - I reviewed patient records, labs, notes, testing and imaging myself where available.     ASSESSMENT AND PLAN 76 y.o. year old male  has a past medical history of Hypertension; Left atrial dilatation; PAF (paroxysmal atrial fibrillation) ([place]); Sinus bradycardia; Sleep apnea; and Syncope. here with:  1. Obstructive sleep apnea on CPAP  Patient download shows excellent compliance and good treatment of his apnea. He is complaining of a popping sound after turning up his humidification. I advised that he should take his machine to his DME company to have them evaluate this. He voiced understanding. I encouraged patient to continue using the CPAP nightly. He voiced understanding. He will follow-up in one year or sooner if needed.  I spent 15 minutes with the patient. 50% of this time was spent  reviewing his CPAP download.      Ward Givens, MSN, NP-C 10/28/2016, 8:32 AM [facility] [address], [place], [place] [address] [phone]

## 2016-10-30 DIAGNOSIS — C44219 Basal cell carcinoma of skin of left ear and external auricular canal: Secondary | ICD-10-CM | POA: Diagnosis not present

## 2016-10-30 DIAGNOSIS — L814 Other melanin hyperpigmentation: Secondary | ICD-10-CM | POA: Diagnosis not present

## 2016-10-30 DIAGNOSIS — L570 Actinic keratosis: Secondary | ICD-10-CM | POA: Diagnosis not present

## 2016-10-30 DIAGNOSIS — L821 Other seborrheic keratosis: Secondary | ICD-10-CM | POA: Diagnosis not present

## 2016-10-30 DIAGNOSIS — D225 Melanocytic nevi of trunk: Secondary | ICD-10-CM | POA: Diagnosis not present

## 2016-12-01 DIAGNOSIS — Z85828 Personal history of other malignant neoplasm of skin: Secondary | ICD-10-CM | POA: Diagnosis not present

## 2016-12-01 DIAGNOSIS — C44219 Basal cell carcinoma of skin of left ear and external auricular canal: Secondary | ICD-10-CM | POA: Diagnosis not present

## 2016-12-15 DIAGNOSIS — K136 Irritative hyperplasia of oral mucosa: Secondary | ICD-10-CM | POA: Diagnosis not present

## 2016-12-15 DIAGNOSIS — D485 Neoplasm of uncertain behavior of skin: Secondary | ICD-10-CM | POA: Diagnosis not present

## 2016-12-15 DIAGNOSIS — Z85828 Personal history of other malignant neoplasm of skin: Secondary | ICD-10-CM | POA: Diagnosis not present

## 2017-01-20 ENCOUNTER — Encounter (HOSPITAL_COMMUNITY)
Admission: RE | Admit: 2017-01-20 | Discharge: 2017-01-20 | Disposition: A | Discharge status: Home / Self Care | Payer: Self-pay | Source: Ambulatory Visit | Attending: Cardiology | Admitting: Cardiology

## 2017-01-20 DIAGNOSIS — I4891 Unspecified atrial fibrillation: Secondary | ICD-10-CM | POA: Insufficient documentation

## 2017-01-22 ENCOUNTER — Encounter (HOSPITAL_COMMUNITY)
Admission: RE | Admit: 2017-01-22 | Discharge: 2017-01-22 | Disposition: A | Discharge status: Home / Self Care | Payer: Self-pay | Source: Ambulatory Visit | Attending: Cardiology | Admitting: Cardiology

## 2017-01-23 ENCOUNTER — Encounter (HOSPITAL_COMMUNITY)
Admission: RE | Admit: 2017-01-23 | Discharge: 2017-01-23 | Disposition: A | Discharge status: Home / Self Care | Payer: Self-pay | Source: Ambulatory Visit | Attending: Cardiology | Admitting: Cardiology

## 2017-01-29 ENCOUNTER — Encounter (HOSPITAL_COMMUNITY)
Admission: RE | Admit: 2017-01-29 | Discharge: 2017-01-29 | Disposition: A | Discharge status: Home / Self Care | Payer: Self-pay | Source: Ambulatory Visit | Attending: Cardiology | Admitting: Cardiology

## 2017-01-30 ENCOUNTER — Encounter (HOSPITAL_COMMUNITY)
Admission: RE | Admit: 2017-01-30 | Discharge: 2017-01-30 | Disposition: A | Discharge status: Home / Self Care | Payer: Self-pay | Source: Ambulatory Visit | Attending: Cardiology | Admitting: Cardiology

## 2017-02-02 ENCOUNTER — Encounter (HOSPITAL_COMMUNITY)
Admission: RE | Admit: 2017-02-02 | Discharge: 2017-02-02 | Disposition: A | Discharge status: Home / Self Care | Payer: Self-pay | Source: Ambulatory Visit | Attending: Cardiology | Admitting: Cardiology

## 2017-02-05 ENCOUNTER — Encounter (HOSPITAL_COMMUNITY)
Admission: RE | Admit: 2017-02-05 | Discharge: 2017-02-05 | Disposition: A | Discharge status: Home / Self Care | Payer: Self-pay | Source: Ambulatory Visit | Attending: Cardiology | Admitting: Cardiology

## 2017-02-05 DIAGNOSIS — I4891 Unspecified atrial fibrillation: Secondary | ICD-10-CM | POA: Insufficient documentation

## 2017-02-06 ENCOUNTER — Encounter (HOSPITAL_COMMUNITY)
Admission: RE | Admit: 2017-02-06 | Discharge: 2017-02-06 | Disposition: A | Discharge status: Home / Self Care | Payer: Self-pay | Source: Ambulatory Visit | Attending: Cardiology | Admitting: Cardiology

## 2017-02-09 ENCOUNTER — Encounter (HOSPITAL_COMMUNITY)
Admission: RE | Admit: 2017-02-09 | Discharge: 2017-02-09 | Disposition: A | Discharge status: Home / Self Care | Payer: Self-pay | Source: Ambulatory Visit | Attending: Cardiology | Admitting: Cardiology

## 2017-02-09 DIAGNOSIS — H25013 Cortical age-related cataract, bilateral: Secondary | ICD-10-CM | POA: Diagnosis not present

## 2017-02-09 DIAGNOSIS — H35033 Hypertensive retinopathy, bilateral: Secondary | ICD-10-CM | POA: Diagnosis not present

## 2017-02-09 DIAGNOSIS — G43809 Other migraine, not intractable, without status migrainosus: Secondary | ICD-10-CM | POA: Diagnosis not present

## 2017-02-09 DIAGNOSIS — H2513 Age-related nuclear cataract, bilateral: Secondary | ICD-10-CM | POA: Diagnosis not present

## 2017-02-10 ENCOUNTER — Encounter (HOSPITAL_COMMUNITY): Payer: Self-pay

## 2017-02-11 ENCOUNTER — Encounter (HOSPITAL_COMMUNITY)
Admission: RE | Admit: 2017-02-11 | Discharge: 2017-02-11 | Disposition: A | Discharge status: Home / Self Care | Payer: Self-pay | Source: Ambulatory Visit | Attending: Cardiology | Admitting: Cardiology

## 2017-02-12 ENCOUNTER — Encounter (HOSPITAL_COMMUNITY): Payer: Self-pay

## 2017-02-13 ENCOUNTER — Encounter (HOSPITAL_COMMUNITY)
Admission: RE | Admit: 2017-02-13 | Discharge: 2017-02-13 | Disposition: A | Discharge status: Home / Self Care | Payer: Self-pay | Source: Ambulatory Visit | Attending: Cardiology | Admitting: Cardiology

## 2017-02-16 ENCOUNTER — Encounter (HOSPITAL_COMMUNITY)
Admission: RE | Admit: 2017-02-16 | Discharge: 2017-02-16 | Disposition: A | Discharge status: Home / Self Care | Payer: Self-pay | Source: Ambulatory Visit | Attending: Cardiology | Admitting: Cardiology

## 2017-02-17 ENCOUNTER — Encounter (HOSPITAL_COMMUNITY): Payer: Self-pay

## 2017-02-18 ENCOUNTER — Encounter (HOSPITAL_COMMUNITY)
Admission: RE | Admit: 2017-02-18 | Discharge: 2017-02-18 | Disposition: A | Discharge status: Home / Self Care | Payer: Self-pay | Source: Ambulatory Visit | Attending: Cardiology | Admitting: Cardiology

## 2017-02-19 ENCOUNTER — Encounter (HOSPITAL_COMMUNITY): Payer: Self-pay

## 2017-02-20 ENCOUNTER — Encounter (HOSPITAL_COMMUNITY)
Admission: RE | Admit: 2017-02-20 | Discharge: 2017-02-20 | Disposition: A | Discharge status: Home / Self Care | Payer: Self-pay | Source: Ambulatory Visit | Attending: Cardiology | Admitting: Cardiology

## 2017-02-23 ENCOUNTER — Encounter (HOSPITAL_COMMUNITY)
Admission: RE | Admit: 2017-02-23 | Discharge: 2017-02-23 | Disposition: A | Discharge status: Home / Self Care | Payer: Self-pay | Source: Ambulatory Visit | Attending: Cardiology | Admitting: Cardiology

## 2017-02-24 ENCOUNTER — Encounter (HOSPITAL_COMMUNITY): Payer: Self-pay

## 2017-02-25 ENCOUNTER — Encounter (HOSPITAL_COMMUNITY)
Admission: RE | Admit: 2017-02-25 | Discharge: 2017-02-25 | Disposition: A | Discharge status: Home / Self Care | Payer: Self-pay | Source: Ambulatory Visit | Attending: Cardiology | Admitting: Cardiology

## 2017-02-26 ENCOUNTER — Encounter (HOSPITAL_COMMUNITY): Payer: Self-pay

## 2017-02-27 ENCOUNTER — Encounter (HOSPITAL_COMMUNITY): Payer: Self-pay

## 2017-03-03 ENCOUNTER — Encounter (HOSPITAL_COMMUNITY): Payer: Self-pay

## 2017-03-05 ENCOUNTER — Encounter (HOSPITAL_COMMUNITY): Payer: Self-pay

## 2017-03-06 ENCOUNTER — Encounter (HOSPITAL_COMMUNITY): Payer: Self-pay

## 2017-03-06 DIAGNOSIS — I4891 Unspecified atrial fibrillation: Secondary | ICD-10-CM | POA: Insufficient documentation

## 2017-03-09 ENCOUNTER — Encounter (HOSPITAL_COMMUNITY)
Admission: RE | Admit: 2017-03-09 | Discharge: 2017-03-09 | Disposition: A | Discharge status: Home / Self Care | Payer: Self-pay | Source: Ambulatory Visit | Attending: Cardiology | Admitting: Cardiology

## 2017-03-10 ENCOUNTER — Encounter (HOSPITAL_COMMUNITY): Payer: Self-pay

## 2017-03-11 ENCOUNTER — Encounter (HOSPITAL_COMMUNITY)
Admission: RE | Admit: 2017-03-11 | Discharge: 2017-03-11 | Disposition: A | Discharge status: Home / Self Care | Payer: Self-pay | Source: Ambulatory Visit | Attending: Cardiology | Admitting: Cardiology

## 2017-03-12 ENCOUNTER — Encounter (HOSPITAL_COMMUNITY): Payer: Self-pay

## 2017-03-12 DIAGNOSIS — R001 Bradycardia, unspecified: Secondary | ICD-10-CM | POA: Diagnosis not present

## 2017-03-12 DIAGNOSIS — G4730 Sleep apnea, unspecified: Secondary | ICD-10-CM | POA: Diagnosis not present

## 2017-03-12 DIAGNOSIS — I480 Paroxysmal atrial fibrillation: Secondary | ICD-10-CM | POA: Diagnosis not present

## 2017-03-13 ENCOUNTER — Encounter (HOSPITAL_COMMUNITY)
Admission: RE | Admit: 2017-03-13 | Discharge: 2017-03-13 | Disposition: A | Discharge status: Home / Self Care | Payer: Self-pay | Source: Ambulatory Visit | Attending: Cardiology | Admitting: Cardiology

## 2017-03-16 ENCOUNTER — Encounter (HOSPITAL_COMMUNITY)
Admission: RE | Admit: 2017-03-16 | Discharge: 2017-03-16 | Disposition: A | Discharge status: Home / Self Care | Payer: Self-pay | Source: Ambulatory Visit | Attending: Cardiology | Admitting: Cardiology

## 2017-03-17 ENCOUNTER — Encounter (HOSPITAL_COMMUNITY): Payer: Self-pay

## 2017-03-19 ENCOUNTER — Encounter (HOSPITAL_COMMUNITY)
Admission: RE | Admit: 2017-03-19 | Discharge: 2017-03-19 | Disposition: A | Discharge status: Home / Self Care | Payer: Self-pay | Source: Ambulatory Visit | Attending: Cardiology | Admitting: Cardiology

## 2017-03-19 DIAGNOSIS — Z125 Encounter for screening for malignant neoplasm of prostate: Secondary | ICD-10-CM | POA: Diagnosis not present

## 2017-03-19 DIAGNOSIS — I10 Essential (primary) hypertension: Secondary | ICD-10-CM | POA: Diagnosis not present

## 2017-03-19 DIAGNOSIS — Z0000 Encounter for general adult medical examination without abnormal findings: Secondary | ICD-10-CM | POA: Diagnosis not present

## 2017-03-20 ENCOUNTER — Encounter (HOSPITAL_COMMUNITY): Payer: Self-pay

## 2017-03-24 ENCOUNTER — Encounter (HOSPITAL_COMMUNITY): Payer: Self-pay

## 2017-03-26 ENCOUNTER — Encounter (HOSPITAL_COMMUNITY): Payer: Self-pay

## 2017-03-26 DIAGNOSIS — Z0000 Encounter for general adult medical examination without abnormal findings: Secondary | ICD-10-CM | POA: Diagnosis not present

## 2017-03-26 DIAGNOSIS — M7732 Calcaneal spur, left foot: Secondary | ICD-10-CM | POA: Diagnosis not present

## 2017-03-26 DIAGNOSIS — Z23 Encounter for immunization: Secondary | ICD-10-CM | POA: Diagnosis not present

## 2017-03-26 DIAGNOSIS — M79672 Pain in left foot: Secondary | ICD-10-CM | POA: Diagnosis not present

## 2017-03-26 DIAGNOSIS — I480 Paroxysmal atrial fibrillation: Secondary | ICD-10-CM | POA: Diagnosis not present

## 2017-03-27 ENCOUNTER — Encounter (HOSPITAL_COMMUNITY): Payer: Self-pay

## 2017-03-30 ENCOUNTER — Encounter (HOSPITAL_COMMUNITY)
Admission: RE | Admit: 2017-03-30 | Discharge: 2017-03-30 | Disposition: A | Discharge status: Home / Self Care | Payer: Self-pay | Source: Ambulatory Visit | Attending: Cardiology | Admitting: Cardiology

## 2017-03-31 ENCOUNTER — Encounter (HOSPITAL_COMMUNITY): Payer: Self-pay

## 2017-04-01 ENCOUNTER — Encounter (HOSPITAL_COMMUNITY)
Admission: RE | Admit: 2017-04-01 | Discharge: 2017-04-01 | Disposition: A | Discharge status: Home / Self Care | Payer: Self-pay | Source: Ambulatory Visit | Attending: Cardiology | Admitting: Cardiology

## 2017-04-02 ENCOUNTER — Encounter (HOSPITAL_COMMUNITY): Payer: Self-pay

## 2017-04-03 ENCOUNTER — Encounter (HOSPITAL_COMMUNITY)
Admission: RE | Admit: 2017-04-03 | Discharge: 2017-04-03 | Disposition: A | Discharge status: Home / Self Care | Payer: Self-pay | Source: Ambulatory Visit | Attending: Cardiology | Admitting: Cardiology

## 2017-04-03 DIAGNOSIS — I4891 Unspecified atrial fibrillation: Secondary | ICD-10-CM | POA: Insufficient documentation

## 2017-04-06 ENCOUNTER — Encounter (HOSPITAL_COMMUNITY)
Admission: RE | Admit: 2017-04-06 | Discharge: 2017-04-06 | Disposition: A | Discharge status: Home / Self Care | Payer: Self-pay | Source: Ambulatory Visit | Attending: Cardiology | Admitting: Cardiology

## 2017-04-07 ENCOUNTER — Encounter (HOSPITAL_COMMUNITY): Payer: Self-pay

## 2017-04-09 ENCOUNTER — Encounter (HOSPITAL_COMMUNITY)
Admission: RE | Admit: 2017-04-09 | Discharge: 2017-04-09 | Disposition: A | Discharge status: Home / Self Care | Payer: Self-pay | Source: Ambulatory Visit | Attending: Cardiology | Admitting: Cardiology

## 2017-04-10 ENCOUNTER — Encounter (HOSPITAL_COMMUNITY): Payer: Self-pay

## 2017-04-13 ENCOUNTER — Encounter (HOSPITAL_COMMUNITY)
Admission: RE | Admit: 2017-04-13 | Discharge: 2017-04-13 | Disposition: A | Discharge status: Home / Self Care | Payer: Self-pay | Source: Ambulatory Visit | Attending: Cardiology | Admitting: Cardiology

## 2017-04-14 ENCOUNTER — Encounter (HOSPITAL_COMMUNITY): Payer: Self-pay

## 2017-04-15 ENCOUNTER — Encounter (HOSPITAL_COMMUNITY)
Admission: RE | Admit: 2017-04-15 | Discharge: 2017-04-15 | Disposition: A | Discharge status: Home / Self Care | Payer: Self-pay | Source: Ambulatory Visit | Attending: Cardiology | Admitting: Cardiology

## 2017-04-16 ENCOUNTER — Encounter (HOSPITAL_COMMUNITY): Payer: Self-pay

## 2017-04-17 ENCOUNTER — Encounter (HOSPITAL_COMMUNITY)
Admission: RE | Admit: 2017-04-17 | Discharge: 2017-04-17 | Disposition: A | Discharge status: Home / Self Care | Payer: Self-pay | Source: Ambulatory Visit | Attending: Cardiology | Admitting: Cardiology

## 2017-04-20 ENCOUNTER — Encounter (HOSPITAL_COMMUNITY)
Admission: RE | Admit: 2017-04-20 | Discharge: 2017-04-20 | Disposition: A | Discharge status: Home / Self Care | Payer: Self-pay | Source: Ambulatory Visit | Attending: Cardiology | Admitting: Cardiology

## 2017-04-21 ENCOUNTER — Encounter (HOSPITAL_COMMUNITY): Payer: Self-pay

## 2017-04-21 DIAGNOSIS — M66821 Spontaneous rupture of other tendons, right upper arm: Secondary | ICD-10-CM | POA: Diagnosis not present

## 2017-04-21 DIAGNOSIS — M66829 Spontaneous rupture of other tendons, unspecified upper arm: Secondary | ICD-10-CM | POA: Diagnosis not present

## 2017-04-22 ENCOUNTER — Encounter (HOSPITAL_COMMUNITY)
Admission: RE | Admit: 2017-04-22 | Discharge: 2017-04-22 | Disposition: A | Discharge status: Home / Self Care | Payer: Self-pay | Source: Ambulatory Visit | Attending: Cardiology | Admitting: Cardiology

## 2017-04-23 ENCOUNTER — Encounter (HOSPITAL_COMMUNITY): Payer: Self-pay

## 2017-04-24 ENCOUNTER — Encounter (HOSPITAL_COMMUNITY)
Admission: RE | Admit: 2017-04-24 | Discharge: 2017-04-24 | Disposition: A | Discharge status: Home / Self Care | Payer: Self-pay | Source: Ambulatory Visit | Attending: Cardiology | Admitting: Cardiology

## 2017-04-27 ENCOUNTER — Encounter (HOSPITAL_COMMUNITY)
Admission: RE | Admit: 2017-04-27 | Discharge: 2017-04-27 | Disposition: A | Discharge status: Home / Self Care | Payer: Self-pay | Source: Ambulatory Visit | Attending: Cardiology | Admitting: Cardiology

## 2017-04-28 ENCOUNTER — Encounter (HOSPITAL_COMMUNITY): Payer: Self-pay

## 2017-04-28 DIAGNOSIS — M66829 Spontaneous rupture of other tendons, unspecified upper arm: Secondary | ICD-10-CM | POA: Diagnosis not present

## 2017-04-28 DIAGNOSIS — M66821 Spontaneous rupture of other tendons, right upper arm: Secondary | ICD-10-CM | POA: Diagnosis not present

## 2017-04-29 ENCOUNTER — Encounter (HOSPITAL_COMMUNITY)
Admission: RE | Admit: 2017-04-29 | Discharge: 2017-04-29 | Disposition: A | Discharge status: Home / Self Care | Payer: Self-pay | Source: Ambulatory Visit | Attending: Cardiology | Admitting: Cardiology

## 2017-04-30 ENCOUNTER — Encounter (HOSPITAL_COMMUNITY): Payer: Self-pay

## 2017-05-01 ENCOUNTER — Encounter (HOSPITAL_COMMUNITY)
Admission: RE | Admit: 2017-05-01 | Discharge: 2017-05-01 | Disposition: A | Discharge status: Home / Self Care | Payer: Self-pay | Source: Ambulatory Visit | Attending: Cardiology | Admitting: Cardiology

## 2017-05-04 ENCOUNTER — Encounter (HOSPITAL_COMMUNITY)
Admission: RE | Admit: 2017-05-04 | Discharge: 2017-05-04 | Disposition: A | Discharge status: Home / Self Care | Payer: Self-pay | Source: Ambulatory Visit | Attending: Cardiology | Admitting: Cardiology

## 2017-05-04 DIAGNOSIS — I4891 Unspecified atrial fibrillation: Secondary | ICD-10-CM | POA: Insufficient documentation

## 2017-05-05 DIAGNOSIS — M66821 Spontaneous rupture of other tendons, right upper arm: Secondary | ICD-10-CM | POA: Diagnosis not present

## 2017-05-06 ENCOUNTER — Encounter (HOSPITAL_COMMUNITY)
Admission: RE | Admit: 2017-05-06 | Discharge: 2017-05-06 | Disposition: A | Discharge status: Home / Self Care | Payer: Self-pay | Source: Ambulatory Visit | Attending: Cardiology | Admitting: Cardiology

## 2017-05-08 ENCOUNTER — Encounter (HOSPITAL_COMMUNITY)
Admission: RE | Admit: 2017-05-08 | Discharge: 2017-05-08 | Disposition: A | Discharge status: Home / Self Care | Payer: Self-pay | Source: Ambulatory Visit | Attending: Cardiology | Admitting: Cardiology

## 2017-05-12 ENCOUNTER — Encounter (HOSPITAL_COMMUNITY): Payer: Self-pay | Admitting: Emergency Medicine

## 2017-05-12 ENCOUNTER — Encounter (HOSPITAL_COMMUNITY)
Admission: RE | Admit: 2017-05-12 | Discharge: 2017-05-12 | Disposition: A | Discharge status: Home / Self Care | Payer: Self-pay | Source: Ambulatory Visit | Attending: Cardiology | Admitting: Cardiology

## 2017-05-12 ENCOUNTER — Encounter (HOSPITAL_COMMUNITY): Payer: Self-pay | Admitting: *Deleted

## 2017-05-12 ENCOUNTER — Other Ambulatory Visit: Payer: Self-pay

## 2017-05-12 NOTE — Progress Notes (Signed)
Anesthesia Chart Review:  Pt is a same day work up.   Pt is a 77 year old male scheduled for a right distal biceps tendon repair on 05/13/2017 with right Apolonio Schneiders, MD  - PCP is Jani Gravel, MD - Cardiologist is Kela Millin, MD who cleared pt for surgery. Last office visit 03/12/17  PMH includes: PAF, sinus bradycardia, syncope, HTN, OSA, melanoma.  Former smoker (quit 1983).  BMI 29.  Medications include: Metoprolol, Xarelto.  Patient to hold Xarelto 48 hours before surgery.  Labs will be obtained day of surgery  EKG 03/12/17 [facility] cardiovascular): NSR.  Frequent PACs.  Echo 03/20/15 [facility] cardiovascular):  1.  LV cavity normal in size.  Mild concentric LVH.  Normal global wall motion.  Doppler evidence of grade high diastolic dysfunction.  Calculated EF 55%. 2.  LA cavity moderately dilated at 4.8 cm. 3.  Mild prolapse of the mitral valve leaflets.  No significant myxomatous changes noted.  Mild mitral regurgitation. 4.  Mild tricuspid regurgitation.  No evidence of pulmonary hypertension. 5.  Mild pulmonic regurgitation  Treadmill exercise stress test 03/19/15 [facility] cardiovascular): 1.  Patient exercised according to Bruce protocol for 9:09 minutes achieving max heart rate of 148 which is 102% of MPHR for age and 10.16 METs of work.  Resting EKG showed NSR.  Normal BP response.  No ST-T changes of ischemia with exercise stress test.  Stress terminated due to [facility] met and mild dyspnea.  No arrhythmias.  Excellent effort.  If labs acceptable day of surgery, I anticipate pt can proceed with surgery as scheduled.   Willeen Cass, FNP-BC [facility]/Anesthesiology Phone: [phone] 05/12/2017 4:13 PM

## 2017-05-13 ENCOUNTER — Encounter (HOSPITAL_COMMUNITY)
Admission: RE | Disposition: A | Discharge status: Home / Self Care | Payer: Self-pay | Source: Ambulatory Visit | Attending: Orthopedic Surgery

## 2017-05-13 ENCOUNTER — Encounter (HOSPITAL_COMMUNITY): Payer: Self-pay | Admitting: Urology

## 2017-05-13 ENCOUNTER — Ambulatory Visit (HOSPITAL_COMMUNITY)
Admission: RE | Admit: 2017-05-13 | Discharge: 2017-05-13 | Disposition: A | Discharge status: Home / Self Care | Payer: Medicare Other | Source: Ambulatory Visit | Attending: Orthopedic Surgery | Admitting: Orthopedic Surgery

## 2017-05-13 DIAGNOSIS — Z85820 Personal history of malignant melanoma of skin: Secondary | ICD-10-CM | POA: Diagnosis not present

## 2017-05-13 DIAGNOSIS — X58XXXA Exposure to other specified factors, initial encounter: Secondary | ICD-10-CM | POA: Insufficient documentation

## 2017-05-13 DIAGNOSIS — Z5309 Procedure and treatment not carried out because of other contraindication: Secondary | ICD-10-CM | POA: Diagnosis not present

## 2017-05-13 DIAGNOSIS — I10 Essential (primary) hypertension: Secondary | ICD-10-CM | POA: Insufficient documentation

## 2017-05-13 DIAGNOSIS — S46211A Strain of muscle, fascia and tendon of other parts of biceps, right arm, initial encounter: Secondary | ICD-10-CM | POA: Diagnosis not present

## 2017-05-13 DIAGNOSIS — Z87891 Personal history of nicotine dependence: Secondary | ICD-10-CM | POA: Diagnosis not present

## 2017-05-13 DIAGNOSIS — G4733 Obstructive sleep apnea (adult) (pediatric): Secondary | ICD-10-CM | POA: Insufficient documentation

## 2017-05-13 DIAGNOSIS — I088 Other rheumatic multiple valve diseases: Secondary | ICD-10-CM | POA: Diagnosis not present

## 2017-05-13 HISTORY — DX: Cardiac arrhythmia, unspecified: I49.9

## 2017-05-13 HISTORY — DX: Malignant (primary) neoplasm, unspecified: C80.1

## 2017-05-13 LAB — BASIC METABOLIC PANEL
ANION GAP: 12 (ref 5–15)
BUN: 19 mg/dL (ref 6–20)
CO2: 22 mmol/L (ref 22–32)
Calcium: 9.4 mg/dL (ref 8.9–10.3)
Chloride: 105 mmol/L (ref 101–111)
Creatinine, Ser: 1.37 mg/dL — ABNORMAL HIGH (ref 0.61–1.24)
GFR calc Af Amer: 55 mL/min — ABNORMAL LOW (ref >60)
GFR calc non Af Amer: 48 mL/min — ABNORMAL LOW (ref >60)
GLUCOSE: 95 mg/dL (ref 65–99)
POTASSIUM: 4.4 mmol/L (ref 3.5–5.1)
Sodium: 139 mmol/L (ref 135–145)

## 2017-05-13 LAB — CBC
HEMATOCRIT: 46.3 % (ref 39.0–52.0)
HEMOGLOBIN: 15.4 g/dL (ref 13.0–17.0)
MCH: 30.3 pg (ref 26.0–34.0)
MCHC: 33.3 g/dL (ref 30.0–36.0)
MCV: 91 fL (ref 78.0–100.0)
Platelets: 237 10*3/uL (ref 150–400)
RBC: 5.09 MIL/uL (ref 4.22–5.81)
RDW: 14 % (ref 11.5–15.5)
WBC: 8.3 10*3/uL (ref 4.0–10.5)

## 2017-05-13 LAB — PROTIME-INR
INR: 1.05
Prothrombin Time: 13.6 seconds (ref 11.4–15.2)

## 2017-05-13 SURGERY — REPAIR, TENDON, BICEPS, DISTAL
Anesthesia: General | Laterality: Right

## 2017-05-13 MED ORDER — FENTANYL CITRATE (PF) 250 MCG/5ML IJ SOLN
INTRAMUSCULAR | Status: AC
Start: 2017-05-13 — End: ?
  Filled 2017-05-13: qty 5

## 2017-05-13 MED ORDER — CEFAZOLIN SODIUM-DEXTROSE 2-4 GM/100ML-% IV SOLN
INTRAVENOUS | Status: AC
  Filled 2017-05-13: qty 100

## 2017-05-13 MED ORDER — LACTATED RINGERS IV SOLN: INTRAVENOUS | Status: DC

## 2017-05-13 MED ORDER — CHLORHEXIDINE GLUCONATE 4 % EX LIQD: 60.0000 mL | Freq: Once | CUTANEOUS | Status: DC

## 2017-05-13 MED ORDER — CEFAZOLIN SODIUM-DEXTROSE 2-4 GM/100ML-% IV SOLN: 2.0000 g | INTRAVENOUS | Status: DC

## 2017-05-13 NOTE — Progress Notes (Signed)
Pt drank 6OZ coffee with creamer this morning at 9AM. Per Dr. Conrad Elba case cannot be done until 3:00 today. Dr. Caralyn Guile notified and spoke with patient via phone. Unable to do surgery today d/t other scheduled case this afternoon. Pt to be rescheduled for Monday?. IV removed and pt discharged to home with wife

## 2017-05-15 ENCOUNTER — Encounter (HOSPITAL_COMMUNITY)
Admission: RE | Admit: 2017-05-15 | Discharge: 2017-05-15 | Disposition: A | Discharge status: Home / Self Care | Payer: Self-pay | Source: Ambulatory Visit | Attending: Cardiology | Admitting: Cardiology

## 2017-05-18 ENCOUNTER — Encounter (HOSPITAL_COMMUNITY)
Admission: RE | Disposition: A | Discharge status: Home / Self Care | Payer: Self-pay | Source: Ambulatory Visit | Attending: Orthopedic Surgery

## 2017-05-18 ENCOUNTER — Ambulatory Visit (HOSPITAL_COMMUNITY): Payer: Medicare Other | Admitting: Anesthesiology

## 2017-05-18 ENCOUNTER — Ambulatory Visit (HOSPITAL_COMMUNITY)
Admission: RE | Admit: 2017-05-18 | Discharge: 2017-05-18 | Disposition: A | Discharge status: Home / Self Care | Payer: Medicare Other | Source: Ambulatory Visit | Attending: Orthopedic Surgery | Admitting: Orthopedic Surgery

## 2017-05-18 ENCOUNTER — Other Ambulatory Visit: Payer: Self-pay

## 2017-05-18 ENCOUNTER — Encounter (HOSPITAL_COMMUNITY): Payer: Self-pay

## 2017-05-18 ENCOUNTER — Encounter (HOSPITAL_COMMUNITY): Payer: Self-pay | Admitting: *Deleted

## 2017-05-18 DIAGNOSIS — R0683 Snoring: Secondary | ICD-10-CM

## 2017-05-18 DIAGNOSIS — X500XXA Overexertion from strenuous movement or load, initial encounter: Secondary | ICD-10-CM | POA: Diagnosis not present

## 2017-05-18 DIAGNOSIS — Z811 Family history of alcohol abuse and dependence: Secondary | ICD-10-CM | POA: Diagnosis not present

## 2017-05-18 DIAGNOSIS — R55 Syncope and collapse: Secondary | ICD-10-CM | POA: Insufficient documentation

## 2017-05-18 DIAGNOSIS — I480 Paroxysmal atrial fibrillation: Secondary | ICD-10-CM | POA: Insufficient documentation

## 2017-05-18 DIAGNOSIS — Z79899 Other long term (current) drug therapy: Secondary | ICD-10-CM | POA: Diagnosis not present

## 2017-05-18 DIAGNOSIS — G4730 Sleep apnea, unspecified: Secondary | ICD-10-CM | POA: Insufficient documentation

## 2017-05-18 DIAGNOSIS — Z7901 Long term (current) use of anticoagulants: Secondary | ICD-10-CM | POA: Insufficient documentation

## 2017-05-18 DIAGNOSIS — Z803 Family history of malignant neoplasm of breast: Secondary | ICD-10-CM | POA: Diagnosis not present

## 2017-05-18 DIAGNOSIS — R001 Bradycardia, unspecified: Secondary | ICD-10-CM | POA: Insufficient documentation

## 2017-05-18 DIAGNOSIS — I10 Essential (primary) hypertension: Secondary | ICD-10-CM | POA: Diagnosis not present

## 2017-05-18 DIAGNOSIS — Z85820 Personal history of malignant melanoma of skin: Secondary | ICD-10-CM | POA: Insufficient documentation

## 2017-05-18 DIAGNOSIS — Y9389 Activity, other specified: Secondary | ICD-10-CM | POA: Diagnosis not present

## 2017-05-18 DIAGNOSIS — S46211A Strain of muscle, fascia and tendon of other parts of biceps, right arm, initial encounter: Secondary | ICD-10-CM | POA: Diagnosis not present

## 2017-05-18 DIAGNOSIS — Z86010 Personal history of colonic polyps: Secondary | ICD-10-CM | POA: Insufficient documentation

## 2017-05-18 DIAGNOSIS — G4733 Obstructive sleep apnea (adult) (pediatric): Secondary | ICD-10-CM | POA: Diagnosis not present

## 2017-05-18 DIAGNOSIS — Z87891 Personal history of nicotine dependence: Secondary | ICD-10-CM | POA: Diagnosis not present

## 2017-05-18 HISTORY — PX: DISTAL BICEPS TENDON REPAIR: SHX1461

## 2017-05-18 LAB — PROTIME-INR
INR: 1.02
Prothrombin Time: 13.3 seconds (ref 11.4–15.2)

## 2017-05-18 SURGERY — REPAIR, TENDON, BICEPS, DISTAL
Anesthesia: General | Laterality: Right

## 2017-05-18 MED ORDER — EPHEDRINE SULFATE 50 MG/ML IJ SOLN
INTRAMUSCULAR | Status: DC | PRN
  Administered 2017-05-18 (×3): 5 mg via INTRAVENOUS

## 2017-05-18 MED ORDER — 0.9 % SODIUM CHLORIDE (POUR BTL) OPTIME
TOPICAL | Status: DC | PRN
  Administered 2017-05-18: 1000 mL

## 2017-05-18 MED ORDER — BUPIVACAINE HCL (PF) 0.25 % IJ SOLN
INTRAMUSCULAR | Status: DC | PRN
  Administered 2017-05-18: 8 mL

## 2017-05-18 MED ORDER — MEPERIDINE HCL 50 MG/ML IJ SOLN: 6.2500 mg | INTRAMUSCULAR | Status: DC | PRN

## 2017-05-18 MED ORDER — PHENYLEPHRINE HCL 10 MG/ML IJ SOLN
INTRAMUSCULAR | Status: AC
  Filled 2017-05-18: qty 2

## 2017-05-18 MED ORDER — PROPOFOL 10 MG/ML IV BOLUS
INTRAVENOUS | Status: DC | PRN
  Administered 2017-05-18: 200 mg via INTRAVENOUS

## 2017-05-18 MED ORDER — FENTANYL CITRATE (PF) 250 MCG/5ML IJ SOLN
INTRAMUSCULAR | Status: AC
  Filled 2017-05-18: qty 5

## 2017-05-18 MED ORDER — LACTATED RINGERS IV SOLN
INTRAVENOUS | Status: DC
  Administered 2017-05-18 (×3): via INTRAVENOUS

## 2017-05-18 MED ORDER — ONDANSETRON HCL 4 MG/2ML IJ SOLN
INTRAMUSCULAR | Status: DC | PRN
  Administered 2017-05-18: 4 mg via INTRAVENOUS

## 2017-05-18 MED ORDER — FENTANYL CITRATE (PF) 100 MCG/2ML IJ SOLN: 25.0000 ug | INTRAMUSCULAR | Status: DC | PRN

## 2017-05-18 MED ORDER — FENTANYL CITRATE (PF) 100 MCG/2ML IJ SOLN
INTRAMUSCULAR | Status: DC | PRN
  Administered 2017-05-18 (×4): 50 ug via INTRAVENOUS

## 2017-05-18 MED ORDER — LIDOCAINE 2% (20 MG/ML) 5 ML SYRINGE
INTRAMUSCULAR | Status: AC
  Filled 2017-05-18: qty 5

## 2017-05-18 MED ORDER — GLYCOPYRROLATE 0.2 MG/ML IJ SOLN
INTRAMUSCULAR | Status: DC | PRN
  Administered 2017-05-18: 0.1 mg via INTRAVENOUS

## 2017-05-18 MED ORDER — BUPIVACAINE HCL (PF) 0.25 % IJ SOLN
INTRAMUSCULAR | Status: AC
  Filled 2017-05-18: qty 30

## 2017-05-18 MED ORDER — CEFAZOLIN SODIUM-DEXTROSE 2-3 GM-%(50ML) IV SOLR
INTRAVENOUS | Status: DC | PRN
  Administered 2017-05-18: 2 g via INTRAVENOUS

## 2017-05-18 MED ORDER — CEFAZOLIN SODIUM-DEXTROSE 2-4 GM/100ML-% IV SOLN
INTRAVENOUS | Status: AC
  Filled 2017-05-18: qty 100

## 2017-05-18 MED ORDER — DEXAMETHASONE SODIUM PHOSPHATE 10 MG/ML IJ SOLN
INTRAMUSCULAR | Status: DC | PRN
  Administered 2017-05-18: 4 mg via INTRAVENOUS

## 2017-05-18 MED ORDER — PHENYLEPHRINE HCL 10 MG/ML IJ SOLN
INTRAMUSCULAR | Status: DC | PRN
  Administered 2017-05-18: 20 ug/min via INTRAVENOUS

## 2017-05-18 MED ORDER — LIDOCAINE HCL (CARDIAC) 20 MG/ML IV SOLN
INTRAVENOUS | Status: DC | PRN
  Administered 2017-05-18: 60 mg via INTRAVENOUS

## 2017-05-18 SURGICAL SUPPLY — 68 items
APL SKNCLS STERI-STRIP NONHPOA (GAUZE/BANDAGES/DRESSINGS) ×1
BANDAGE ACE 3X5.8 VEL STRL LF (GAUZE/BANDAGES/DRESSINGS) IMPLANT
BANDAGE ACE 4X5 VEL STRL LF (GAUZE/BANDAGES/DRESSINGS) IMPLANT
BENZOIN TINCTURE PRP APPL 2/3 (GAUZE/BANDAGES/DRESSINGS) ×2 IMPLANT
BLADE CLIPPER SURG (BLADE) IMPLANT
BNDG CMPR 9X4 STRL LF SNTH (GAUZE/BANDAGES/DRESSINGS) ×1
BNDG ESMARK 4X9 LF (GAUZE/BANDAGES/DRESSINGS) ×2 IMPLANT
BNDG GAUZE ELAST 4 BULKY (GAUZE/BANDAGES/DRESSINGS) ×1 IMPLANT
BUR EGG ELITE 4.0 (BURR) ×2 IMPLANT
CLIP VESOCCLUDE MED 6/CT (CLIP) ×2 IMPLANT
CLIP VESOCCLUDE SM WIDE 6/CT (CLIP) ×1 IMPLANT
CORDS BIPOLAR (ELECTRODE) ×2 IMPLANT
COVER SURGICAL LIGHT HANDLE (MISCELLANEOUS) ×2 IMPLANT
CUFF TOURNIQUET SINGLE 18IN (TOURNIQUET CUFF) ×2 IMPLANT
CUFF TOURNIQUET SINGLE 24IN (TOURNIQUET CUFF) IMPLANT
DRAIN TLS ROUND 10FR (DRAIN) IMPLANT
DRAPE OEC MINIVIEW 54X84 (DRAPES) ×1 IMPLANT
DRAPE SURG 17X23 STRL (DRAPES) ×2 IMPLANT
DRAPE U-SHAPE 47X51 STRL (DRAPES) ×2 IMPLANT
DRSG ADAPTIC 3X8 NADH LF (GAUZE/BANDAGES/DRESSINGS) ×1 IMPLANT
ELECT REM PT RETURN 9FT ADLT (ELECTROSURGICAL) ×2
ELECTRODE REM PT RTRN 9FT ADLT (ELECTROSURGICAL) ×1 IMPLANT
GAUZE SPONGE 4X4 12PLY STRL (GAUZE/BANDAGES/DRESSINGS) ×1 IMPLANT
GLOVE BIOGEL PI IND STRL 6.5 (GLOVE) IMPLANT
GLOVE BIOGEL PI IND STRL 8.5 (GLOVE) ×1 IMPLANT
GLOVE BIOGEL PI INDICATOR 6.5 (GLOVE) ×2
GLOVE BIOGEL PI INDICATOR 8.5 (GLOVE) ×1
GLOVE SURG ORTHO 8.0 STRL STRW (GLOVE) ×2 IMPLANT
GLOVE SURG SS PI 6.5 STRL IVOR (GLOVE) ×2 IMPLANT
GOWN STRL REUS W/ TWL LRG LVL3 (GOWN DISPOSABLE) ×1 IMPLANT
GOWN STRL REUS W/ TWL XL LVL3 (GOWN DISPOSABLE) ×1 IMPLANT
GOWN STRL REUS W/TWL LRG LVL3 (GOWN DISPOSABLE) ×2
GOWN STRL REUS W/TWL XL LVL3 (GOWN DISPOSABLE) ×2
KIT BASIN OR (CUSTOM PROCEDURE TRAY) ×2 IMPLANT
KIT BIO-TENODESIS 3X8 DISP (MISCELLANEOUS)
KIT INSRT BABSR STRL DISP BTN (MISCELLANEOUS) IMPLANT
KIT TURNOVER KIT B (KITS) ×2 IMPLANT
LOOP VESSEL MAXI BLUE (MISCELLANEOUS) IMPLANT
MANIFOLD NEPTUNE II (INSTRUMENTS) ×2 IMPLANT
NDL SUT 6 .5 CRC .975X.05 MAYO (NEEDLE) IMPLANT
NEEDLE 22X1 1/2 (OR ONLY) (NEEDLE) IMPLANT
NEEDLE MAYO TAPER (NEEDLE)
NS IRRIG 1000ML POUR BTL (IV SOLUTION) ×2 IMPLANT
PACK ORTHO EXTREMITY (CUSTOM PROCEDURE TRAY) ×2 IMPLANT
PAD ARMBOARD 7.5X6 YLW CONV (MISCELLANEOUS) ×4 IMPLANT
PAD CAST 4YDX4 CTTN HI CHSV (CAST SUPPLIES) ×2 IMPLANT
PADDING CAST COTTON 4X4 STRL (CAST SUPPLIES) ×2
SOAP 2 % CHG 4 OZ (WOUND CARE) ×2 IMPLANT
SPLINT FIBERGLASS 4X30 (CAST SUPPLIES) ×2 IMPLANT
SPONGE LAP 4X18 X RAY DECT (DISPOSABLE) ×2 IMPLANT
STRIP CLOSURE SKIN 1/2X4 (GAUZE/BANDAGES/DRESSINGS) ×4 IMPLANT
SUT 2 FIBERLOOP 20 STRT BLUE (SUTURE) ×2
SUT FIBERWIRE #2 38 T-5 BLUE (SUTURE)
SUT MNCRL AB 3-0 PS2 18 (SUTURE) IMPLANT
SUT MNCRL AB 4-0 PS2 18 (SUTURE) IMPLANT
SUT PROLENE 4 0 PS 2 18 (SUTURE) IMPLANT
SUT VIC AB 3-0 PS1 18 (SUTURE)
SUT VIC AB 3-0 PS1 18XBRD (SUTURE) IMPLANT
SUTURE 2 FIBERLOOP 20 STRT BLU (SUTURE) IMPLANT
SUTURE FIBERWR #2 38 T-5 BLUE (SUTURE) IMPLANT
SYR CONTROL 10ML LL (SYRINGE) IMPLANT
SYSTEM ARTHRO FOR DISTAL BICEP (Orthopedic Implant) ×1 IMPLANT
SYSTEM CHEST DRAIN TLS 7FR (DRAIN) IMPLANT
TOWEL OR 17X24 6PK STRL BLUE (TOWEL DISPOSABLE) ×2 IMPLANT
TOWEL OR 17X26 10 PK STRL BLUE (TOWEL DISPOSABLE) ×2 IMPLANT
TUBE CONNECTING 12X1/4 (SUCTIONS) ×2 IMPLANT
UNDERPAD 30X30 (UNDERPADS AND DIAPERS) ×3 IMPLANT
WATER STERILE IRR 1000ML POUR (IV SOLUTION) ×2 IMPLANT

## 2017-05-18 NOTE — H&P (Signed)
Matthew Cannon is an 77 y.o. male.   Chief Complaint: RIGHT DISTAL BICEPS TENDON TEAR  HPI: Matthew Cannon is a 77 y/o right hand dominant male who injured the right arm on 04/06/17 when he was lifting a piece of wood. He felt a pop in the arm followed by pain.  He was seen in our office for evaluation and the MRI was completed which showed a tear of the distal biceps tendon.  Discussed the reason and rationale for surgery, including the risks versus benefits, and the post-operative recovery.  The patient is here today for surgery.  He denies chest pain, shortness of breath, nausea, vomiting, diarrhea, fever, or chills.  Past Medical History:  Diagnosis Date  . Cancer ([place])    Melanoma- forehead and cheek - left side of face  . Dysrhythmia    PAF  . Hypertension    not on medications-   . Left atrial dilatation   . PAF (paroxysmal atrial fibrillation) ([place])   . Sinus bradycardia   . Sleep apnea    uses CPAP  . Syncope     Past Surgical History:  Procedure Laterality Date  . ADENOIDECTOMY    . COLONOSCOPY    . COLONOSCOPY W/ POLYPECTOMY    . FOOT SURGERY Left    had bone spur  . INGUINAL HERNIA REPAIR Bilateral 12/15/2012   Procedure: LAPAROSCOPIC BILATERAL INGUINAL HERNIA LEFT DIRECT AND INDIRECT HERNIA  AND RIGHT DIRECT HERNIA WITH MESH;  Surgeon: Gayland Curry, MD;  Location: [facility];  Service: General;  Laterality: Bilateral;  . INSERTION OF MESH Bilateral 12/15/2012   Procedure: INSERTION OF MESH BILATERAL;  Surgeon: Gayland Curry, MD;  Location: [facility];  Service: General;  Laterality: Bilateral;  INGUINAL  . melanoma removal  2016   forehead, left face cheek  . TONSILLECTOMY    . VASECTOMY      Family History  Problem Relation Age of Onset  . Suicidality Father   . Alcohol abuse Father   . Cancer Daughter        breast   Social History:  reports that he quit smoking about 36 years ago. He quit after 23.00 years of use. He has never used smokeless tobacco. He  reports that he drinks about 2.4 oz of alcohol per week. He reports that he does not use drugs.  Allergies:  Allergies  Allergen Reactions  . Zithromax [Azithromycin] Diarrhea    Medications Prior to Admission  Medication Sig Dispense Refill  . metoprolol tartrate (LOPRESSOR) 25 MG tablet Take 1 tablet (25 mg total) by mouth 2 (two) times daily as needed (Palpitations). (Patient taking differently: Take 12.5 mg by mouth 2 (two) times daily as needed (Palpitations). ) 180 tablet 3  . rivaroxaban (XARELTO) 20 MG TABS tablet Take 20 mg by mouth daily with supper.      Results for orders placed or performed during the hospital encounter of 05/18/17 (from the past 48 hour(s))  Protime-INR     Status: None   Collection Time: 05/18/17  1:04 PM  Result Value Ref Range   Prothrombin Time 13.3 11.4 - 15.2 seconds   INR 1.02     Comment: Performed at [facility], [place] [address] [place], [place] [address]   No results found.  ROS NO RECENT ILLNESSES OR HOSPITALIZATIONS  Blood pressure (!) 168/81, pulse 75, temperature 97.6 F (36.4 C), temperature source Oral, resp. rate 18, height 5\' 10"  (1.778 m), weight 192 lb (87.1 kg),  SpO2 98 %. Physical Exam  General Appearance:  Alert, cooperative, no distress, appears stated age  Head:  Normocephalic, without obvious abnormality, atraumatic  Eyes:  Pupils equal, conjunctiva/corneas clear,         Throat: Lips, mucosa, and tongue normal; teeth and gums normal  Neck: No visible masses     Lungs:   respirations unlabored  Chest Wall:  No tenderness or deformity  Heart:  Regular rate and rhythm,  Abdomen:   Soft, non-tender,         Extremities: RUE: SKIN INTACT, FINGERS WARM WELL PERFUSED GOOD CAP REFIL ABLE TO EXTEND THUMB AND FINGERS   Pulses: 2+ and symmetric  Skin: Skin color, texture, turgor normal, no rashes or lesions     Neurologic: Normal    Assessment RIGHT DISTAL BICEPS TENDON TEAR   Plan RIGHT DISTAL BICEPS  TENDON REPAIR AND/OR RECONSTRUCTION  R/B/A DISCUSSED WITH PT IN OFFICE.  PT VOICED UNDERSTANDING OF PLAN CONSENT SIGNED DAY OF SURGERY PT SEEN AND EXAMINED PRIOR TO OPERATIVE PROCEDURE/DAY OF SURGERY SITE MARKED. QUESTIONS ANSWERED WILL GO HOME FOLLOWING SURGERY  WE ARE PLANNING SURGERY FOR YOUR UPPER EXTREMITY. THE RISKS AND BENEFITS OF SURGERY INCLUDE BUT NOT LIMITED TO BLEEDING INFECTION, DAMAGE TO NEARBY NERVES ARTERIES TENDONS, FAILURE OF SURGERY TO ACCOMPLISH ITS INTENDED GOALS, PERSISTENT SYMPTOMS AND NEED FOR FURTHER SURGICAL INTERVENTION. WITH THIS IN MIND WE WILL PROCEED. I HAVE DISCUSSED WITH THE PATIENT THE PRE AND POSTOPERATIVE REGIMEN AND THE DOS AND DON'TS. PT VOICED UNDERSTANDING AND INFORMED CONSENT SIGNED.   Linna Hoff 05/18/2017, 3:51 PM

## 2017-05-18 NOTE — Anesthesia Preprocedure Evaluation (Signed)
Anesthesia Evaluation  Patient identified by MRN, date of birth, ID band Patient awake    Reviewed: Allergy & Precautions, H&P , NPO status , Patient's Chart, lab work & pertinent test results  Airway Mallampati: II  TM Distance: >3 FB Neck ROM: Full    Dental no notable dental hx. (+) Dental Advisory Given   Pulmonary former smoker,    Pulmonary exam normal breath sounds clear to auscultation       Cardiovascular hypertension, Pt. on medications Normal cardiovascular exam Rhythm:Regular Rate:Normal     Neuro/Psych negative neurological ROS  negative psych ROS   GI/Hepatic negative GI ROS, Neg liver ROS,   Endo/Other  negative endocrine ROS  Renal/GU negative Renal ROS  negative genitourinary   Musculoskeletal negative musculoskeletal ROS (+)   Abdominal   Peds negative pediatric ROS (+)  Hematology negative hematology ROS (+)   Anesthesia Other Findings   Reproductive/Obstetrics negative OB ROS                             Anesthesia Physical  Anesthesia Plan  ASA: II  Anesthesia Plan: General   Post-op Pain Management:    Induction: Intravenous  PONV Risk Score and Plan: 1 and Treatment may vary due to age or medical condition  Airway Management Planned: LMA  Additional Equipment:   Intra-op Plan:   Post-operative Plan: Extubation in OR  Informed Consent: I have reviewed the patients History and Physical, chart, labs and discussed the procedure including the risks, benefits and alternatives for the proposed anesthesia with the patient or authorized representative who has indicated his/her understanding and acceptance.   Dental advisory given  Plan Discussed with: CRNA, Anesthesiologist and Surgeon  Anesthesia Plan Comments: ( )        Anesthesia Quick Evaluation

## 2017-05-18 NOTE — Anesthesia Procedure Notes (Signed)
Procedure Name: LMA Insertion Date/Time: 05/18/2017 5:03 PM Performed by: Inda Coke, CRNA Pre-anesthesia Checklist: Patient identified, Emergency Drugs available, Suction available and Patient being monitored Patient Re-evaluated:Patient Re-evaluated prior to induction Oxygen Delivery Method: Circle System Utilized Preoxygenation: Pre-oxygenation with 100% oxygen Induction Type: IV induction Ventilation: Mask ventilation without difficulty LMA: LMA inserted LMA Size: 5.0 Number of attempts: 1 Airway Equipment and Method: Bite block Placement Confirmation: positive ETCO2 Tube secured with: Tape Dental Injury: Teeth and Oropharynx as per pre-operative assessment

## 2017-05-18 NOTE — Discharge Instructions (Signed)
KEEP BANDAGE CLEAN AND DRY CALL OFFICE FOR F/U APPT [phone] in 14 days DR [facility] CELL [phone] RX SENT TO [facility] KEEP HAND ELEVATED ABOVE HEART OK TO APPLY ICE TO OPERATIVE AREA CONTACT OFFICE IF ANY WORSENING PAIN OR CONCERNS.

## 2017-05-18 NOTE — Op Note (Signed)
PREOPERATIVE DIAGNOSIS: Right elbow distal biceps tendon rupture  POSTOPERATIVE DIAGNOSIS: Same  ATTENDING SURGEON: Dr. Iran Planas who was scrubbed and present for the entire procedure  ASSISTANT SURGEON: Gertie Fey PA-C who was scrubbed and necessary for the entire procedure who helped aid in exposure placement of the tendon closure and splinting in a timely fashion  ANESTHESIA: Gen. via LMA  OPERATIVE PROCEDURE: #1: Right elbow distal biceps tendon repair #2: Radiographs 3 views right elbow  IMPLANTS: Ar[employer]rex Endobutton  RADIOGRAPHIC INTERPRETATION: AP lateral oblique views the elbow do show the Endobutton in place in good position  SURGICAL INDICATIONS: Right-hand-dominant gentleman who sustained the eccentric load to his right elbow. Patient sustained a closed injury to the distal biceps. Patient was seen and evaluated in the office and recommended undergo the above procedure. Risks of surgery include but not limited to bleeding infection damage to nearby nerves arteries or tendons heterotopic bone ossification loss of motion of wrists and digits incomplete relief of symptoms tendon rupture need for further surgical intervention.  SURGICAL TECHNIQUE: Patient is properly divided the preoperative holding area and marked for permanent marker made on the right elbow to indicate the correct operative site. Patient brought back to the operating placed supine on anesthesia and table where general anesthesia was administered. Patient tolerated this well. Able the right upper extremity was then prepped and draped in normal sterile fashion. A timeout was called the correct site was identified and the procedure was then begun. A longitudinal incision made directly in the antecubital region for several centimeters extending distally. Dissection carried down through the skin and subcutaneous tissues. Blunt dissection was then carried out proximally where the biceps tendon was then  identified. The tendon was then delivered into the wound and using the fiber tape suture material the tendon was then prepared bring the 2 limbs of the tendon out distally. These were then fed through the Ar[employer] Endobutton in the tendon had been trimmed down and appropriately sized for an 7 mm reamer. After preparation the tendon dissection was then carried down distally all the way down to the radial tuberosity. Crossing vasculature was then ligated, crossing veins ligated with small and medium vessel clips. Deep dissection carried all the way down to the tuberosity with a tuberosity was identified. The tendon remnant was then identified on the bone. After identification of the anatomical location of the tendon the guidepin was then placed in the radial tuberosity along the anterior cortex. Position then confirmed using the mini C-arm. This was then drilled through the posterior cortex the near cortex was then overreamed with 8 mm reamer. Thorough wound irrigation done to remove any loose bone fragments. Following this the tendon was then delivered into the the tunnel the Endobutton was secured nicely along the posterior cortex. The fiber loop tape was then reinforced and tied back onto itself reinforcing the repair. The wound was thoroughly irrigated. Final radiographs were then obtained. The subcutaneous tissues were then closed with 3-0 Monocryl. The skin was then closed with 4-0 Prolene. Adaptic dressing a sterile compressive bandage then applied. 10 mL accord percent Marcaine infiltrated locally around the region. Patient is then placed in a long-arm splint and extubated taken recovery room in good condition  POSTOPERATIVE PLAN: Patient be discharged home. Seen back now office in 2 weeks Lateral x-ray of the elbow down to see down to see her therapist for a long-arm splint Follow-up at the 2 week mark 6 week mark and likely 12 week mark.

## 2017-05-18 NOTE — Transfer of Care (Signed)
Immediate Anesthesia Transfer of Care Note  Patient: Matthew Cannon  Procedure(s) Performed: DISTAL BICEPS TENDON REPAIR (Right )  Patient Location: PACU  Anesthesia Type:General  Level of Consciousness: awake, alert  and oriented  Airway & Oxygen Therapy: Patient Spontanous Breathing and Patient connected to nasal cannula oxygen  Post-op Assessment: Report given to RN and Post -op Vital signs reviewed and stable  Post vital signs: Reviewed and stable  Last Vitals:  Vitals Value Taken Time  BP 157/93 05/18/2017  5:54 PM  Temp    Pulse 73 05/18/2017  5:55 PM  Resp 14 05/18/2017  5:55 PM  SpO2 97 % 05/18/2017  5:55 PM  Vitals shown include unvalidated device data.  Last Pain:  Vitals:   05/18/17 1244  TempSrc: Oral      Patients Stated Pain Goal: 7 (53/[id number]/82 [id number])  Complications: No apparent anesthesia complications

## 2017-05-19 DIAGNOSIS — M66829 Spontaneous rupture of other tendons, unspecified upper arm: Secondary | ICD-10-CM | POA: Diagnosis not present

## 2017-05-19 DIAGNOSIS — M66821 Spontaneous rupture of other tendons, right upper arm: Secondary | ICD-10-CM | POA: Diagnosis not present

## 2017-05-19 NOTE — Anesthesia Postprocedure Evaluation (Signed)
Anesthesia Post Note  Patient: Matthew Cannon  Procedure(s) Performed: DISTAL BICEPS TENDON REPAIR (Right )     Patient location during evaluation: PACU Anesthesia Type: General Level of consciousness: awake and alert Pain management: pain level controlled Vital Signs Assessment: post-procedure vital signs reviewed and stable Respiratory status: spontaneous breathing, nonlabored ventilation, respiratory function stable and patient connected to nasal cannula oxygen Cardiovascular status: blood pressure returned to baseline and stable Postop Assessment: no apparent nausea or vomiting Anesthetic complications: no    Last Vitals:  Vitals:   05/18/17 1809 05/18/17 1820  BP:  (!) 163/85  Pulse:  80  Resp: 19 15  Temp:  36.4 C  SpO2:  99%    Last Pain:  Vitals:   05/18/17 1820  TempSrc:   PainSc: 5                  Tiajuana Amass

## 2017-05-20 ENCOUNTER — Encounter (HOSPITAL_COMMUNITY): Payer: Self-pay

## 2017-05-21 ENCOUNTER — Encounter (HOSPITAL_COMMUNITY): Payer: Self-pay | Admitting: Orthopedic Surgery

## 2017-05-22 ENCOUNTER — Encounter (HOSPITAL_COMMUNITY): Payer: Self-pay

## 2017-05-25 ENCOUNTER — Encounter (HOSPITAL_COMMUNITY): Payer: Self-pay

## 2017-05-27 ENCOUNTER — Encounter (HOSPITAL_COMMUNITY): Payer: Self-pay

## 2017-05-29 ENCOUNTER — Encounter (HOSPITAL_COMMUNITY): Payer: Self-pay

## 2017-06-01 ENCOUNTER — Encounter (HOSPITAL_COMMUNITY): Payer: Self-pay

## 2017-06-01 DIAGNOSIS — M66829 Spontaneous rupture of other tendons, unspecified upper arm: Secondary | ICD-10-CM | POA: Diagnosis not present

## 2017-06-01 DIAGNOSIS — M66821 Spontaneous rupture of other tendons, right upper arm: Secondary | ICD-10-CM | POA: Diagnosis not present

## 2017-06-01 DIAGNOSIS — Z4789 Encounter for other orthopedic aftercare: Secondary | ICD-10-CM | POA: Diagnosis not present

## 2017-06-01 DIAGNOSIS — M25621 Stiffness of right elbow, not elsewhere classified: Secondary | ICD-10-CM | POA: Diagnosis not present

## 2017-06-03 ENCOUNTER — Encounter (HOSPITAL_COMMUNITY)
Admission: RE | Admit: 2017-06-03 | Discharge: 2017-06-03 | Disposition: A | Discharge status: Home / Self Care | Payer: Self-pay | Source: Ambulatory Visit | Attending: Cardiology | Admitting: Cardiology

## 2017-06-03 DIAGNOSIS — I4891 Unspecified atrial fibrillation: Secondary | ICD-10-CM | POA: Insufficient documentation

## 2017-06-04 ENCOUNTER — Encounter (HOSPITAL_COMMUNITY)
Admission: RE | Admit: 2017-06-04 | Discharge: 2017-06-04 | Disposition: A | Discharge status: Home / Self Care | Payer: Self-pay | Source: Ambulatory Visit | Attending: Cardiology | Admitting: Cardiology

## 2017-06-05 ENCOUNTER — Encounter (HOSPITAL_COMMUNITY)
Admission: RE | Admit: 2017-06-05 | Discharge: 2017-06-05 | Disposition: A | Discharge status: Home / Self Care | Payer: Self-pay | Source: Ambulatory Visit | Attending: Cardiology | Admitting: Cardiology

## 2017-06-08 ENCOUNTER — Encounter (HOSPITAL_COMMUNITY): Payer: Self-pay

## 2017-06-08 DIAGNOSIS — M25629 Stiffness of unspecified elbow, not elsewhere classified: Secondary | ICD-10-CM | POA: Diagnosis not present

## 2017-06-09 ENCOUNTER — Encounter (HOSPITAL_COMMUNITY)
Admission: RE | Admit: 2017-06-09 | Discharge: 2017-06-09 | Disposition: A | Discharge status: Home / Self Care | Payer: Self-pay | Source: Ambulatory Visit | Attending: Cardiology | Admitting: Cardiology

## 2017-06-10 ENCOUNTER — Encounter (HOSPITAL_COMMUNITY)
Admission: RE | Admit: 2017-06-10 | Discharge: 2017-06-10 | Disposition: A | Discharge status: Home / Self Care | Payer: Self-pay | Source: Ambulatory Visit | Attending: Cardiology | Admitting: Cardiology

## 2017-06-10 ENCOUNTER — Encounter (HOSPITAL_COMMUNITY): Payer: Self-pay

## 2017-06-11 ENCOUNTER — Encounter (HOSPITAL_COMMUNITY)
Admission: RE | Admit: 2017-06-11 | Discharge: 2017-06-11 | Disposition: A | Discharge status: Home / Self Care | Payer: Self-pay | Source: Ambulatory Visit | Attending: Cardiology | Admitting: Cardiology

## 2017-06-12 ENCOUNTER — Encounter (HOSPITAL_COMMUNITY): Payer: Self-pay

## 2017-06-15 ENCOUNTER — Encounter (HOSPITAL_COMMUNITY): Payer: Self-pay

## 2017-06-15 DIAGNOSIS — M25621 Stiffness of right elbow, not elsewhere classified: Secondary | ICD-10-CM | POA: Diagnosis not present

## 2017-06-16 ENCOUNTER — Encounter (HOSPITAL_COMMUNITY)
Admission: RE | Admit: 2017-06-16 | Discharge: 2017-06-16 | Disposition: A | Discharge status: Home / Self Care | Payer: Self-pay | Source: Ambulatory Visit | Attending: Cardiology | Admitting: Cardiology

## 2017-06-17 ENCOUNTER — Encounter (HOSPITAL_COMMUNITY): Payer: Self-pay

## 2017-06-18 ENCOUNTER — Encounter (HOSPITAL_COMMUNITY)
Admission: RE | Admit: 2017-06-18 | Discharge: 2017-06-18 | Disposition: A | Discharge status: Home / Self Care | Payer: Self-pay | Source: Ambulatory Visit | Attending: Cardiology | Admitting: Cardiology

## 2017-06-19 ENCOUNTER — Encounter (HOSPITAL_COMMUNITY): Payer: Self-pay

## 2017-06-19 DIAGNOSIS — G4733 Obstructive sleep apnea (adult) (pediatric): Secondary | ICD-10-CM | POA: Diagnosis not present

## 2017-06-22 ENCOUNTER — Encounter (HOSPITAL_COMMUNITY): Payer: Self-pay

## 2017-06-23 ENCOUNTER — Encounter (HOSPITAL_COMMUNITY)
Admission: RE | Admit: 2017-06-23 | Discharge: 2017-06-23 | Disposition: A | Discharge status: Home / Self Care | Payer: Self-pay | Source: Ambulatory Visit | Attending: Cardiology | Admitting: Cardiology

## 2017-06-24 ENCOUNTER — Encounter (HOSPITAL_COMMUNITY): Payer: Self-pay

## 2017-06-25 ENCOUNTER — Encounter (HOSPITAL_COMMUNITY)
Admission: RE | Admit: 2017-06-25 | Discharge: 2017-06-25 | Disposition: A | Discharge status: Home / Self Care | Payer: Self-pay | Source: Ambulatory Visit | Attending: Cardiology | Admitting: Cardiology

## 2017-06-26 ENCOUNTER — Encounter (HOSPITAL_COMMUNITY): Payer: Self-pay

## 2017-06-26 ENCOUNTER — Encounter (HOSPITAL_COMMUNITY)
Admission: RE | Admit: 2017-06-26 | Discharge: 2017-06-26 | Disposition: A | Discharge status: Home / Self Care | Payer: Self-pay | Source: Ambulatory Visit | Attending: Cardiology | Admitting: Cardiology

## 2017-06-26 DIAGNOSIS — M25629 Stiffness of unspecified elbow, not elsewhere classified: Secondary | ICD-10-CM | POA: Diagnosis not present

## 2017-06-30 ENCOUNTER — Encounter (HOSPITAL_COMMUNITY): Payer: Self-pay

## 2017-07-01 ENCOUNTER — Encounter (HOSPITAL_COMMUNITY)
Admission: RE | Admit: 2017-07-01 | Discharge: 2017-07-01 | Disposition: A | Discharge status: Home / Self Care | Payer: Self-pay | Source: Ambulatory Visit | Attending: Cardiology | Admitting: Cardiology

## 2017-07-01 ENCOUNTER — Encounter (HOSPITAL_COMMUNITY): Payer: Self-pay

## 2017-07-02 ENCOUNTER — Encounter (HOSPITAL_COMMUNITY)
Admission: RE | Admit: 2017-07-02 | Discharge: 2017-07-02 | Disposition: A | Discharge status: Home / Self Care | Payer: Self-pay | Source: Ambulatory Visit | Attending: Cardiology | Admitting: Cardiology

## 2017-07-02 DIAGNOSIS — M25629 Stiffness of unspecified elbow, not elsewhere classified: Secondary | ICD-10-CM | POA: Diagnosis not present

## 2017-07-03 ENCOUNTER — Encounter (HOSPITAL_COMMUNITY)
Admission: RE | Admit: 2017-07-03 | Discharge: 2017-07-03 | Disposition: A | Discharge status: Home / Self Care | Payer: Self-pay | Source: Ambulatory Visit | Attending: Cardiology | Admitting: Cardiology

## 2017-07-03 ENCOUNTER — Encounter (HOSPITAL_COMMUNITY): Payer: Self-pay

## 2017-07-06 ENCOUNTER — Encounter (HOSPITAL_COMMUNITY)
Admission: RE | Admit: 2017-07-06 | Discharge: 2017-07-06 | Disposition: A | Discharge status: Home / Self Care | Payer: Self-pay | Source: Ambulatory Visit | Attending: Cardiology | Admitting: Cardiology

## 2017-07-06 ENCOUNTER — Encounter (HOSPITAL_COMMUNITY): Payer: Self-pay

## 2017-07-06 DIAGNOSIS — I4891 Unspecified atrial fibrillation: Secondary | ICD-10-CM | POA: Insufficient documentation

## 2017-07-07 ENCOUNTER — Encounter (HOSPITAL_COMMUNITY)
Admission: RE | Admit: 2017-07-07 | Discharge: 2017-07-07 | Disposition: A | Discharge status: Home / Self Care | Payer: Self-pay | Source: Ambulatory Visit | Attending: Cardiology | Admitting: Cardiology

## 2017-07-08 ENCOUNTER — Encounter (HOSPITAL_COMMUNITY): Payer: Self-pay

## 2017-07-09 ENCOUNTER — Encounter (HOSPITAL_COMMUNITY): Payer: Self-pay

## 2017-07-10 ENCOUNTER — Encounter (HOSPITAL_COMMUNITY): Payer: Self-pay

## 2017-07-13 ENCOUNTER — Encounter (HOSPITAL_COMMUNITY): Payer: Self-pay

## 2017-07-13 ENCOUNTER — Encounter (HOSPITAL_COMMUNITY)
Admission: RE | Admit: 2017-07-13 | Discharge: 2017-07-13 | Disposition: A | Discharge status: Home / Self Care | Payer: Self-pay | Source: Ambulatory Visit | Attending: Cardiology | Admitting: Cardiology

## 2017-07-13 DIAGNOSIS — M25621 Stiffness of right elbow, not elsewhere classified: Secondary | ICD-10-CM | POA: Diagnosis not present

## 2017-07-14 ENCOUNTER — Encounter (HOSPITAL_COMMUNITY): Payer: Self-pay

## 2017-07-15 ENCOUNTER — Encounter (HOSPITAL_COMMUNITY)
Admission: RE | Admit: 2017-07-15 | Discharge: 2017-07-15 | Disposition: A | Discharge status: Home / Self Care | Payer: Self-pay | Source: Ambulatory Visit | Attending: Cardiology | Admitting: Cardiology

## 2017-07-15 ENCOUNTER — Encounter (HOSPITAL_COMMUNITY): Payer: Self-pay

## 2017-07-16 ENCOUNTER — Encounter (HOSPITAL_COMMUNITY): Payer: Self-pay

## 2017-07-17 ENCOUNTER — Encounter (HOSPITAL_COMMUNITY): Payer: Self-pay

## 2017-07-17 ENCOUNTER — Encounter (HOSPITAL_COMMUNITY)
Admission: RE | Admit: 2017-07-17 | Discharge: 2017-07-17 | Disposition: A | Discharge status: Home / Self Care | Payer: Self-pay | Source: Ambulatory Visit | Attending: Cardiology | Admitting: Cardiology

## 2017-07-20 ENCOUNTER — Encounter (HOSPITAL_COMMUNITY): Payer: Self-pay

## 2017-07-20 ENCOUNTER — Encounter (HOSPITAL_COMMUNITY)
Admission: RE | Admit: 2017-07-20 | Discharge: 2017-07-20 | Disposition: A | Discharge status: Home / Self Care | Payer: Self-pay | Source: Ambulatory Visit | Attending: Cardiology | Admitting: Cardiology

## 2017-07-20 DIAGNOSIS — M25629 Stiffness of unspecified elbow, not elsewhere classified: Secondary | ICD-10-CM | POA: Diagnosis not present

## 2017-07-21 ENCOUNTER — Encounter (HOSPITAL_COMMUNITY): Payer: Self-pay

## 2017-07-22 ENCOUNTER — Encounter (HOSPITAL_COMMUNITY): Payer: Self-pay

## 2017-07-22 ENCOUNTER — Encounter (HOSPITAL_COMMUNITY)
Admission: RE | Admit: 2017-07-22 | Discharge: 2017-07-22 | Disposition: A | Discharge status: Home / Self Care | Payer: Self-pay | Source: Ambulatory Visit | Attending: Cardiology | Admitting: Cardiology

## 2017-07-23 ENCOUNTER — Encounter (HOSPITAL_COMMUNITY): Payer: Self-pay

## 2017-07-24 ENCOUNTER — Encounter (HOSPITAL_COMMUNITY): Payer: Self-pay

## 2017-07-27 ENCOUNTER — Encounter (HOSPITAL_COMMUNITY): Payer: Self-pay

## 2017-07-27 ENCOUNTER — Encounter (HOSPITAL_COMMUNITY)
Admission: RE | Admit: 2017-07-27 | Discharge: 2017-07-27 | Disposition: A | Discharge status: Home / Self Care | Payer: Self-pay | Source: Ambulatory Visit | Attending: Cardiology | Admitting: Cardiology

## 2017-07-28 ENCOUNTER — Encounter (HOSPITAL_COMMUNITY): Payer: Self-pay

## 2017-07-29 ENCOUNTER — Encounter (HOSPITAL_COMMUNITY)
Admission: RE | Admit: 2017-07-29 | Discharge: 2017-07-29 | Disposition: A | Discharge status: Home / Self Care | Payer: Self-pay | Source: Ambulatory Visit | Attending: Cardiology | Admitting: Cardiology

## 2017-07-29 ENCOUNTER — Encounter (HOSPITAL_COMMUNITY): Payer: Self-pay

## 2017-07-30 ENCOUNTER — Encounter (HOSPITAL_COMMUNITY): Payer: Self-pay

## 2017-07-31 ENCOUNTER — Encounter (HOSPITAL_COMMUNITY): Payer: Self-pay

## 2017-07-31 ENCOUNTER — Encounter (HOSPITAL_COMMUNITY)
Admission: RE | Admit: 2017-07-31 | Discharge: 2017-07-31 | Disposition: A | Discharge status: Home / Self Care | Payer: Self-pay | Source: Ambulatory Visit | Attending: Cardiology | Admitting: Cardiology

## 2017-08-03 DIAGNOSIS — M25629 Stiffness of unspecified elbow, not elsewhere classified: Secondary | ICD-10-CM | POA: Diagnosis not present

## 2017-08-04 ENCOUNTER — Encounter (HOSPITAL_COMMUNITY)
Admission: RE | Admit: 2017-08-04 | Discharge: 2017-08-04 | Disposition: A | Discharge status: Home / Self Care | Payer: Medicare Other | Source: Ambulatory Visit | Attending: Cardiology | Admitting: Cardiology

## 2017-08-04 DIAGNOSIS — I4891 Unspecified atrial fibrillation: Secondary | ICD-10-CM | POA: Insufficient documentation

## 2017-08-07 ENCOUNTER — Encounter (HOSPITAL_COMMUNITY)
Admission: RE | Admit: 2017-08-07 | Discharge: 2017-08-07 | Disposition: A | Discharge status: Home / Self Care | Payer: Medicare Other | Source: Ambulatory Visit | Attending: Internal Medicine | Admitting: Internal Medicine

## 2017-08-10 ENCOUNTER — Encounter (HOSPITAL_COMMUNITY)
Admission: RE | Admit: 2017-08-10 | Discharge: 2017-08-10 | Disposition: A | Discharge status: Home / Self Care | Payer: Medicare Other | Source: Ambulatory Visit | Attending: Cardiology | Admitting: Cardiology

## 2017-08-11 ENCOUNTER — Encounter (HOSPITAL_COMMUNITY)
Admission: RE | Admit: 2017-08-11 | Discharge: 2017-08-11 | Disposition: A | Discharge status: Home / Self Care | Payer: Medicare Other | Source: Ambulatory Visit | Attending: Cardiology | Admitting: Cardiology

## 2017-08-14 ENCOUNTER — Encounter (HOSPITAL_COMMUNITY)
Admission: RE | Admit: 2017-08-14 | Discharge: 2017-08-14 | Disposition: A | Discharge status: Home / Self Care | Payer: Medicare Other | Source: Ambulatory Visit | Attending: Cardiology | Admitting: Cardiology

## 2017-08-18 ENCOUNTER — Encounter (HOSPITAL_COMMUNITY)
Admission: RE | Admit: 2017-08-18 | Discharge: 2017-08-18 | Disposition: A | Discharge status: Home / Self Care | Payer: Medicare Other | Source: Ambulatory Visit | Attending: Cardiology | Admitting: Cardiology

## 2017-08-20 ENCOUNTER — Encounter (HOSPITAL_COMMUNITY)
Admission: RE | Admit: 2017-08-20 | Discharge: 2017-08-20 | Disposition: A | Discharge status: Home / Self Care | Payer: Medicare Other | Source: Ambulatory Visit | Attending: Cardiology | Admitting: Cardiology

## 2017-08-21 ENCOUNTER — Encounter (HOSPITAL_COMMUNITY)
Admission: RE | Admit: 2017-08-21 | Discharge: 2017-08-21 | Disposition: A | Discharge status: Home / Self Care | Payer: Medicare Other | Source: Ambulatory Visit | Attending: Cardiology | Admitting: Cardiology

## 2017-08-25 ENCOUNTER — Encounter (HOSPITAL_COMMUNITY)
Admission: RE | Admit: 2017-08-25 | Discharge: 2017-08-25 | Disposition: A | Discharge status: Home / Self Care | Payer: Medicare Other | Source: Ambulatory Visit | Attending: Cardiology | Admitting: Cardiology

## 2017-08-27 ENCOUNTER — Encounter (HOSPITAL_COMMUNITY)
Admission: RE | Admit: 2017-08-27 | Discharge: 2017-08-27 | Disposition: A | Discharge status: Home / Self Care | Payer: Medicare Other | Source: Ambulatory Visit | Attending: Cardiology | Admitting: Cardiology

## 2017-08-28 ENCOUNTER — Encounter (HOSPITAL_COMMUNITY)
Admission: RE | Admit: 2017-08-28 | Discharge: 2017-08-28 | Disposition: A | Discharge status: Home / Self Care | Payer: Medicare Other | Source: Ambulatory Visit | Attending: Cardiology | Admitting: Cardiology

## 2017-09-01 ENCOUNTER — Encounter (HOSPITAL_COMMUNITY): Payer: Medicare Other

## 2017-09-03 ENCOUNTER — Encounter (HOSPITAL_COMMUNITY): Payer: Medicare Other

## 2017-09-04 ENCOUNTER — Encounter (HOSPITAL_COMMUNITY): Payer: Medicare Other

## 2017-09-04 DIAGNOSIS — I4891 Unspecified atrial fibrillation: Secondary | ICD-10-CM | POA: Insufficient documentation

## 2017-09-07 ENCOUNTER — Encounter (HOSPITAL_COMMUNITY)
Admission: RE | Admit: 2017-09-07 | Discharge: 2017-09-07 | Disposition: A | Discharge status: Home / Self Care | Payer: Medicare Other | Source: Ambulatory Visit | Attending: Cardiology | Admitting: Cardiology

## 2017-09-08 ENCOUNTER — Encounter (HOSPITAL_COMMUNITY)
Admission: RE | Admit: 2017-09-08 | Discharge: 2017-09-08 | Disposition: A | Discharge status: Home / Self Care | Payer: Medicare Other | Source: Ambulatory Visit | Attending: Cardiology | Admitting: Cardiology

## 2017-09-10 ENCOUNTER — Encounter (HOSPITAL_COMMUNITY): Payer: Medicare Other

## 2017-09-11 ENCOUNTER — Encounter (HOSPITAL_COMMUNITY): Payer: Medicare Other

## 2017-09-14 ENCOUNTER — Encounter (HOSPITAL_COMMUNITY)
Admission: RE | Admit: 2017-09-14 | Discharge: 2017-09-14 | Disposition: A | Discharge status: Home / Self Care | Payer: Medicare Other | Source: Ambulatory Visit | Attending: Cardiology | Admitting: Cardiology

## 2017-09-14 DIAGNOSIS — G4730 Sleep apnea, unspecified: Secondary | ICD-10-CM | POA: Diagnosis not present

## 2017-09-14 DIAGNOSIS — R001 Bradycardia, unspecified: Secondary | ICD-10-CM | POA: Diagnosis not present

## 2017-09-14 DIAGNOSIS — I480 Paroxysmal atrial fibrillation: Secondary | ICD-10-CM | POA: Diagnosis not present

## 2017-09-14 DIAGNOSIS — R0989 Other specified symptoms and signs involving the circulatory and respiratory systems: Secondary | ICD-10-CM | POA: Diagnosis not present

## 2017-09-15 ENCOUNTER — Encounter (HOSPITAL_COMMUNITY)
Admission: RE | Admit: 2017-09-15 | Discharge: 2017-09-15 | Disposition: A | Discharge status: Home / Self Care | Payer: Medicare Other | Source: Ambulatory Visit | Attending: Cardiology | Admitting: Cardiology

## 2017-09-17 ENCOUNTER — Encounter (HOSPITAL_COMMUNITY): Payer: Medicare Other

## 2017-09-18 ENCOUNTER — Encounter (HOSPITAL_COMMUNITY)
Admission: RE | Admit: 2017-09-18 | Discharge: 2017-09-18 | Disposition: A | Discharge status: Home / Self Care | Payer: Medicare Other | Source: Ambulatory Visit | Attending: Cardiology | Admitting: Cardiology

## 2017-09-22 ENCOUNTER — Encounter (HOSPITAL_COMMUNITY)
Admission: RE | Admit: 2017-09-22 | Discharge: 2017-09-22 | Disposition: A | Discharge status: Home / Self Care | Payer: Medicare Other | Source: Ambulatory Visit | Attending: Cardiology | Admitting: Cardiology

## 2017-09-23 DIAGNOSIS — I480 Paroxysmal atrial fibrillation: Secondary | ICD-10-CM | POA: Diagnosis not present

## 2017-09-24 ENCOUNTER — Encounter (HOSPITAL_COMMUNITY): Payer: Medicare Other

## 2017-09-25 ENCOUNTER — Encounter (HOSPITAL_COMMUNITY): Payer: Medicare Other

## 2017-09-29 ENCOUNTER — Encounter (HOSPITAL_COMMUNITY)
Admission: RE | Admit: 2017-09-29 | Discharge: 2017-09-29 | Disposition: A | Discharge status: Home / Self Care | Payer: Medicare Other | Source: Ambulatory Visit | Attending: Cardiology | Admitting: Cardiology

## 2017-09-29 DIAGNOSIS — M66821 Spontaneous rupture of other tendons, right upper arm: Secondary | ICD-10-CM | POA: Diagnosis not present

## 2017-10-01 ENCOUNTER — Encounter (HOSPITAL_COMMUNITY)
Admission: RE | Admit: 2017-10-01 | Discharge: 2017-10-01 | Disposition: A | Discharge status: Home / Self Care | Payer: Medicare Other | Source: Ambulatory Visit | Attending: Cardiology | Admitting: Cardiology

## 2017-10-01 DIAGNOSIS — I10 Essential (primary) hypertension: Secondary | ICD-10-CM | POA: Diagnosis not present

## 2017-10-01 DIAGNOSIS — R739 Hyperglycemia, unspecified: Secondary | ICD-10-CM | POA: Diagnosis not present

## 2017-10-01 DIAGNOSIS — I480 Paroxysmal atrial fibrillation: Secondary | ICD-10-CM | POA: Diagnosis not present

## 2017-10-01 DIAGNOSIS — E7841 Elevated Lipoprotein(a): Secondary | ICD-10-CM | POA: Diagnosis not present

## 2017-10-02 ENCOUNTER — Encounter (HOSPITAL_COMMUNITY)
Admission: RE | Admit: 2017-10-02 | Discharge: 2017-10-02 | Disposition: A | Discharge status: Home / Self Care | Payer: Medicare Other | Source: Ambulatory Visit | Attending: Cardiology | Admitting: Cardiology

## 2017-10-06 ENCOUNTER — Encounter (HOSPITAL_COMMUNITY)
Admission: RE | Admit: 2017-10-06 | Discharge: 2017-10-06 | Disposition: A | Discharge status: Home / Self Care | Payer: Medicare Other | Source: Ambulatory Visit | Attending: Cardiology | Admitting: Cardiology

## 2017-10-06 DIAGNOSIS — I4891 Unspecified atrial fibrillation: Secondary | ICD-10-CM | POA: Insufficient documentation

## 2017-10-08 ENCOUNTER — Encounter (HOSPITAL_COMMUNITY)
Admission: RE | Admit: 2017-10-08 | Discharge: 2017-10-08 | Disposition: A | Discharge status: Home / Self Care | Payer: Medicare Other | Source: Ambulatory Visit | Attending: Cardiology | Admitting: Cardiology

## 2017-10-09 ENCOUNTER — Encounter (HOSPITAL_COMMUNITY): Payer: Medicare Other

## 2017-10-13 ENCOUNTER — Encounter (HOSPITAL_COMMUNITY)
Admission: RE | Admit: 2017-10-13 | Discharge: 2017-10-13 | Disposition: A | Discharge status: Home / Self Care | Payer: Medicare Other | Source: Ambulatory Visit | Attending: Cardiology | Admitting: Cardiology

## 2017-10-15 ENCOUNTER — Encounter (HOSPITAL_COMMUNITY)
Admission: RE | Admit: 2017-10-15 | Discharge: 2017-10-15 | Disposition: A | Discharge status: Home / Self Care | Payer: Medicare Other | Source: Ambulatory Visit | Attending: Cardiology | Admitting: Cardiology

## 2017-10-16 ENCOUNTER — Encounter (HOSPITAL_COMMUNITY)
Admission: RE | Admit: 2017-10-16 | Discharge: 2017-10-16 | Disposition: A | Discharge status: Home / Self Care | Payer: Medicare Other | Source: Ambulatory Visit | Attending: Cardiology | Admitting: Cardiology

## 2017-10-20 ENCOUNTER — Encounter (HOSPITAL_COMMUNITY)
Admission: RE | Admit: 2017-10-20 | Discharge: 2017-10-20 | Disposition: A | Discharge status: Home / Self Care | Payer: Medicare Other | Source: Ambulatory Visit | Attending: Cardiology | Admitting: Cardiology

## 2017-10-22 ENCOUNTER — Encounter (HOSPITAL_COMMUNITY)
Admission: RE | Admit: 2017-10-22 | Discharge: 2017-10-22 | Disposition: A | Discharge status: Home / Self Care | Payer: Medicare Other | Source: Ambulatory Visit | Attending: Cardiology | Admitting: Cardiology

## 2017-10-23 ENCOUNTER — Encounter (HOSPITAL_COMMUNITY)
Admission: RE | Admit: 2017-10-23 | Discharge: 2017-10-23 | Disposition: A | Discharge status: Home / Self Care | Payer: Medicare Other | Source: Ambulatory Visit | Attending: Cardiology | Admitting: Cardiology

## 2017-10-26 ENCOUNTER — Encounter: Payer: Self-pay | Admitting: Neurology

## 2017-10-27 ENCOUNTER — Encounter (HOSPITAL_COMMUNITY)
Admission: RE | Admit: 2017-10-27 | Discharge: 2017-10-27 | Disposition: A | Discharge status: Home / Self Care | Payer: Medicare Other | Source: Ambulatory Visit | Attending: Cardiology | Admitting: Cardiology

## 2017-10-29 ENCOUNTER — Encounter: Payer: Self-pay | Admitting: Neurology

## 2017-10-29 ENCOUNTER — Ambulatory Visit: Payer: Medicare Other | Admitting: Neurology

## 2017-10-29 ENCOUNTER — Encounter (HOSPITAL_COMMUNITY)
Admission: RE | Admit: 2017-10-29 | Discharge: 2017-10-29 | Disposition: A | Discharge status: Home / Self Care | Payer: Medicare Other | Source: Ambulatory Visit | Attending: Cardiology | Admitting: Cardiology

## 2017-10-29 VITALS — BP 128/80 | HR 59 | Ht 70.0 in | Wt 201.0 lb

## 2017-10-29 DIAGNOSIS — G4733 Obstructive sleep apnea (adult) (pediatric): Secondary | ICD-10-CM

## 2017-10-29 DIAGNOSIS — R001 Bradycardia, unspecified: Secondary | ICD-10-CM | POA: Diagnosis not present

## 2017-10-29 DIAGNOSIS — I481 Persistent atrial fibrillation: Secondary | ICD-10-CM | POA: Diagnosis not present

## 2017-10-29 DIAGNOSIS — Z9989 Dependence on other enabling machines and devices: Secondary | ICD-10-CM | POA: Diagnosis not present

## 2017-10-29 DIAGNOSIS — R0989 Other specified symptoms and signs involving the circulatory and respiratory systems: Secondary | ICD-10-CM | POA: Diagnosis not present

## 2017-10-29 DIAGNOSIS — I4819 Other persistent atrial fibrillation: Secondary | ICD-10-CM

## 2017-10-29 NOTE — Progress Notes (Signed)
SLEEP MEDICINE CLINIC   Provider:  Larey Seat, M D  Referring Provider: Jani Gravel, MD Primary Care Physician:  Jani Gravel, MD  Chief Complaint  Patient presents with  . Sleep Consult    Alone. Rm 11. Patient stated that since he's started using his CPAP machine he feels well rested.     HPI:  RICHRD KUZNIAR is a 77 y.o. male , seen here as a referral from Dr. Einar Gip for a sleep evaluation, to rule out in the setting of atrial fibrillation.   The patient had undergone a 30 day cardiac monitor , which captured 4 atrial fib attacks, 3 in sleep. He started on 3 medications , which he doesn't tolerate well. He feels sick and clammy on the medications. He has chronic rhinitis and sinusitis.  [name] has undergone 2 Mohs surgeries, one on the left neck just below the lower jaw and one on the high forehead. This will be important should CPAP treatment be indicated after our evaluation is completed.  Mr. [facility] medical records speak of a cardiac workup following syncope spells ; there were 3 near syncopal episodes in October - November 2016 the first one occurred while he was still working in the heat outside and he may have been dehydrated or overheated, the second and third Near-syncope occurred inside without any exertion. The symptoms lasted each time between 4 and 5 minutes or even less. He had no chest pain or tightness and no shortness of breath associated Dr. Ellwood Sayers workup included an event monitor and echocardiogram and a cardiac stress test he continued to have intermittent episodes of dizziness.  10-29-2017, RV on CPAP.  Mr. ARTIN MCEUEN is seen here today is a 77 year old male patient originally referred by Dr. Einar Gip.  He has for several months not needed any kind of beta-blocker to control his heart rate and had no syncopal episodes or near syncope's.  He also did not receive a pacemaker as his heart rate is now in the low normal range. The patient is a CPAP  patient who is using an autotitrator after being diagnosed with obstructive sleep apnea he has been 100% compliant over the last 90 days and 30 days.  His 30-day download shows an average use at time of 7 hours and 38 minutes at night pressure window is between 5 and 10 cmH2O pressure with 3 cm expiratory relief.  His residual AHI is 2.6 which speaks for an excellent resolution.  He does have only minor air leakage and the 95th percentile pressure is 9.6.  I would actually like to increase his maximum pressure window by 2 cm to 12 cm water.    Sleep habits are as follows:The patient usually goes to bed between 10 and 11 PM, he is currently using one small pillow but used multiple pillows as a head rest before. He prefers the supine sleep position, the bedroom is cool, quiet and dark. He has nocturia 4-6 times each night- now 3-4 times a night , his sinus blockage makes breathing difficulties and he snores.  He drinks a lot of water. He rises at 7 AM , often already awake after 5 AM. He doesn't wake with or from headaches and he is usually well refreshed. No alarm needed. He feels not restless, only sometimes light headed and that's not the case for month now.   Sleep medical history and family sleep history: 10-29-2017,  Atrial fib, seen by Dr. Rayann Heman - has not needed a pacemaker  he reports, as heart rate has not been lower than 40 bpm, yet he had several syncope episodes. Reduced Toprol dose.  In the meantime developed a masked face and hoarse voice, no tremor.   Social history: married, retired, 2 sisters, 3 adult children. Non smoker , 3 glasses of ETOH per week. His current medications include Xarelto, lisinopril he is now also on flecainide and metoprolol. The patient has a very remote smoking history of one pack per day until1983. He is a modest alcohol drinker with 3 drinks per week and does not use caffeine aged beverages.    Review of Systems: Out of a complete 14 system review, the patient  complains of only the following symptoms, and all other reviewed systems are negative. How likely are you to doze in the following situations: 0 = not likely, 1 = slight chance, 2 = moderate chance, 3 = high chance  Sitting and Reading? 2 Watching Television?  2 Sitting inactive in a public place (theater or meeting)? 0 Lying down in the afternoon when circumstances permit?3 Sitting and talking to someone?0 Sitting quietly after lunch without alcohol 1 In a car, while stopped for a few minutes in traffic? 0 As a passenger in a car for an hour without a break? 0  Total =  8 points from 15 pre CPAP.  He takes an afternoon nap around 1.30 PM of one hour 3-4 times a week.   Fatigue severity score 24  , depression score n/a    Social History   Socioeconomic History  . Marital status: Married    Spouse name: Not on file  . Number of children: Not on file  . Years of education: Not on file  . Highest education level: Not on file  Occupational History  . Not on file  Social Needs  . Financial resource strain: Not on file  . Food insecurity:    Worry: Not on file    Inability: Not on file  . Transportation needs:    Medical: Not on file    Non-medical: Not on file  Tobacco Use  . Smoking status: Former Smoker    Years: 23.00    Last attempt to quit: 03/06/1981    Years since quitting: 36.6  . Smokeless tobacco: Never Used  Substance and Sexual Activity  . Alcohol use: Yes    Alcohol/week: 4.0 standard drinks    Types: 1 Standard drinks or equivalent, 3 Glasses of wine per week    Comment: 3 x week, 1-2 wine or a beer, on Friday nights a Gin Martini  . Drug use: No  . Sexual activity: Not on file  Lifestyle  . Physical activity:    Days per week: Not on file    Minutes per session: Not on file  . Stress: Not on file  Relationships  . Social connections:    Talks on phone: Not on file    Gets together: Not on file    Attends religious service: Not on file    Active  member of club or organization: Not on file    Attends meetings of clubs or organizations: Not on file    Relationship status: Not on file  . Intimate partner violence:    Fear of current or ex partner: Not on file    Emotionally abused: Not on file    Physically abused: Not on file    Forced sexual activity: Not on file  Other Topics Concern  . Not on file  Social History Narrative   Lives in [place] with spouse.  Retired from Radiation protection practitioner    Family History  Problem Relation Age of Onset  . Suicidality Father   . Alcohol abuse Father   . Cancer Daughter        breast    Past Medical History:  Diagnosis Date  . Cancer ([place])    Melanoma- forehead and cheek - left side of face  . Dysrhythmia    PAF  . Hypertension    not on medications-   . Left atrial dilatation   . PAF (paroxysmal atrial fibrillation) ([place])   . Sinus bradycardia   . Sleep apnea    uses CPAP  . Syncope     Past Surgical History:  Procedure Laterality Date  . ADENOIDECTOMY    . COLONOSCOPY    . COLONOSCOPY W/ POLYPECTOMY    . DISTAL BICEPS TENDON REPAIR Right 05/18/2017   Procedure: DISTAL BICEPS TENDON REPAIR;  Surgeon: Iran Planas, MD;  Location: [facility];  Service: Orthopedics;  Laterality: Right;  . FOOT SURGERY Left    had bone spur  . INGUINAL HERNIA REPAIR Bilateral 12/15/2012   Procedure: LAPAROSCOPIC BILATERAL INGUINAL HERNIA LEFT DIRECT AND INDIRECT HERNIA  AND RIGHT DIRECT HERNIA WITH MESH;  Surgeon: Gayland Curry, MD;  Location: [facility];  Service: General;  Laterality: Bilateral;  . INSERTION OF MESH Bilateral 12/15/2012   Procedure: INSERTION OF MESH BILATERAL;  Surgeon: Gayland Curry, MD;  Location: [facility];  Service: General;  Laterality: Bilateral;  INGUINAL  . melanoma removal  2016   forehead, left face cheek  . TONSILLECTOMY    . VASECTOMY      Current Outpatient Medications  Medication Sig Dispense Refill  . metoprolol tartrate (LOPRESSOR) 25 MG tablet Take 12.5 mg by mouth 2  (two) times daily as needed.    . rivaroxaban (XARELTO) 20 MG TABS tablet Take 20 mg by mouth daily with supper.     No current facility-administered medications for this visit.     Allergies as of 10/29/2017 - Review Complete 10/29/2017  Allergen Reaction Noted  . Zithromax [azithromycin] Diarrhea 06/11/2012    Vitals: BP 128/80   Pulse (!) 59   Ht 5\' 10"  (1.778 m)   Wt 201 lb (91.2 kg)   BMI 28.84 kg/m  Last Weight:  Wt Readings from Last 1 Encounters:  10/29/17 201 lb (91.2 kg)   QMG:QQPY mass index is 28.84 kg/m.     Last Height:   Ht Readings from Last 1 Encounters:  10/29/17 5\' 10"  (1.778 m)    Physical exam:  General: The patient is awake, alert and appears not in acute distress. The patient is well groomed. Head: Normocephalic, atraumatic. Neck is supple. Mallampati 2, deviated to the left. ,  neck circumference: 16.5 . Nasal airflow restricted ,Retrognathia is not seen.  Cardiovascular:  Regular rate and rhythm, without  murmurs or carotid bruit, and without distended neck veins. Respiratory: Lungs are clear to auscultation. Skin:  Without evidence of edema, or rash Trunk: BMI is normal . The patient's posture is erect  Neurologic exam : The patient is awake and alert, oriented to place and time.   Memory subjective described as intact.  Attention span & concentration ability appears normal.  Speech is fluent,  with dysphonia , not aphasia.  Mood and affect are appropriate.  Cranial nerves:  reduced ability to smell- for years, no change in taste. Pupils are equal and briskly reactive to light.  Extraocular movements  in vertical and horizontal planes intact and without nystagmus. Visual fields by finger perimetry are intact. Hearing to finger rub intact.   Facial sensation intact to fine touch.  Facial motor strength is symmetric - and he has a reduced facial expression/ mimic. His tongue and uvula move midline. Shoulder shrug is symmetrical.   Motor exam:   Normal tone, muscle bulk and symmetric strength in all extremities. Sensory:  Fine touch, pinprick and vibration were intact - Proprioception tested in the upper extremities was normal. Coordination:  Finger-to-nose maneuver  normal without evidence of ataxia, dysmetria or tremor. Gait and station: Patient walks without assistive device. Strength within normal limits.  Stance is stable and normal.   Deep tendon reflexes: in the  upper and lower extremities are symmetric and intact.    The patient was advised of the nature of the diagnosed sleep disorder , the treatment options and risks for general a health and wellness arising from not treating the condition.  I spent more than 20 minutes of face to face time with the patient. Greater than 50% of time was spent in counseling and coordination of care. We have discussed the diagnosis and differential and I answered the patient's questions.     Assessment:  After physical and neurologic examination, review of laboratory studies,  Personal review of imaging studies, reports of other /same  Imaging studies ,  Results of polysomnography/ neurophysiology testing and pre-existing records as far as provided in visit., my assessment is   1) Mr. Maiolo has OSA and is well treated on CPAP auto settings and large FFM, but could benefit from an increase in pressure to 12 cm maximum.   2)He still has often a drippy nose and a chronic congested nasal passage.  3) atrial fibrillation and frequent nocturia have [ersited, but he is less bradycardic. [name] Kitchen  4) early Parkinsonian features.    RV with NP in 12 month, CPAP compliance and a d finger to nose test will be needed, consider orthostatic BP and heart rate.    Asencion Partridge Deloma Spindle MD  10/29/2017  cc; Dr . Einar Gip,    CC: Jani Gravel, [address] [place], [place] [address]

## 2017-10-30 ENCOUNTER — Encounter (HOSPITAL_COMMUNITY)
Admission: RE | Admit: 2017-10-30 | Discharge: 2017-10-30 | Disposition: A | Discharge status: Home / Self Care | Payer: Medicare Other | Source: Ambulatory Visit | Attending: Cardiology | Admitting: Cardiology

## 2017-11-03 ENCOUNTER — Encounter (HOSPITAL_COMMUNITY): Payer: Self-pay

## 2017-11-03 DIAGNOSIS — I4891 Unspecified atrial fibrillation: Secondary | ICD-10-CM | POA: Insufficient documentation

## 2017-11-05 ENCOUNTER — Encounter (HOSPITAL_COMMUNITY): Payer: Self-pay

## 2017-11-06 ENCOUNTER — Encounter (HOSPITAL_COMMUNITY): Payer: Self-pay

## 2017-11-10 ENCOUNTER — Encounter (HOSPITAL_COMMUNITY)
Admission: RE | Admit: 2017-11-10 | Discharge: 2017-11-10 | Disposition: A | Discharge status: Home / Self Care | Payer: Self-pay | Source: Ambulatory Visit | Attending: Cardiology | Admitting: Cardiology

## 2017-11-12 ENCOUNTER — Encounter (HOSPITAL_COMMUNITY)
Admission: RE | Admit: 2017-11-12 | Discharge: 2017-11-12 | Disposition: A | Discharge status: Home / Self Care | Payer: Self-pay | Source: Ambulatory Visit | Attending: Cardiology | Admitting: Cardiology

## 2017-11-13 ENCOUNTER — Encounter (HOSPITAL_COMMUNITY)
Admission: RE | Admit: 2017-11-13 | Discharge: 2017-11-13 | Disposition: A | Discharge status: Home / Self Care | Payer: Self-pay | Source: Ambulatory Visit | Attending: Cardiology | Admitting: Cardiology

## 2017-11-13 DIAGNOSIS — Z23 Encounter for immunization: Secondary | ICD-10-CM | POA: Diagnosis not present

## 2017-11-17 ENCOUNTER — Encounter (HOSPITAL_COMMUNITY)
Admission: RE | Admit: 2017-11-17 | Discharge: 2017-11-17 | Disposition: A | Discharge status: Home / Self Care | Payer: Self-pay | Source: Ambulatory Visit | Attending: Cardiology | Admitting: Cardiology

## 2017-11-18 ENCOUNTER — Telehealth: Payer: Self-pay | Admitting: Neurology

## 2017-11-18 NOTE — Telephone Encounter (Signed)
Pt has returned the call to RN Casey, he is asking for a call back °

## 2017-11-18 NOTE — Telephone Encounter (Signed)
Called the patient. There was no answer. LVM informing the patient that we have never used [facility] for CPAP care and supplies. Informed the patient that if he would like he should contact them and see if they offer care for CPAP and provide CPAP supplies as well as if they accept his insurance. With it being a medical supply company I feel they are mostly used for walkers, wheelchairs compression stockings, not for CPAP.  LVM for the patient to call me back if they do cover this for him and if so I can certainly send everything over.

## 2017-11-18 NOTE — Telephone Encounter (Signed)
Called the patient and they do not have the remote capability at [facility] to tap into the CPAP. They do carry some supplies but don't manage the CPAP. Patient states he will stick with [facility].

## 2017-11-18 NOTE — Telephone Encounter (Signed)
Pt is asking for his supplier of his CPAP supplies be switched from [facility] to [facility](which is around the corner from pt) please call

## 2017-11-19 ENCOUNTER — Encounter (HOSPITAL_COMMUNITY)
Admission: RE | Admit: 2017-11-19 | Discharge: 2017-11-19 | Disposition: A | Discharge status: Home / Self Care | Payer: Self-pay | Source: Ambulatory Visit | Attending: Cardiology | Admitting: Cardiology

## 2017-11-20 ENCOUNTER — Encounter (HOSPITAL_COMMUNITY)
Admission: RE | Admit: 2017-11-20 | Discharge: 2017-11-20 | Disposition: A | Discharge status: Home / Self Care | Payer: Self-pay | Source: Ambulatory Visit | Attending: Cardiology | Admitting: Cardiology

## 2017-11-24 ENCOUNTER — Encounter (HOSPITAL_COMMUNITY)
Admission: RE | Admit: 2017-11-24 | Discharge: 2017-11-24 | Disposition: A | Discharge status: Home / Self Care | Payer: Self-pay | Source: Ambulatory Visit | Attending: Cardiology | Admitting: Cardiology

## 2017-11-26 ENCOUNTER — Encounter (HOSPITAL_COMMUNITY)
Admission: RE | Admit: 2017-11-26 | Discharge: 2017-11-26 | Disposition: A | Discharge status: Home / Self Care | Payer: Self-pay | Source: Ambulatory Visit | Attending: Cardiology | Admitting: Cardiology

## 2017-11-26 DIAGNOSIS — D2262 Melanocytic nevi of left upper limb, including shoulder: Secondary | ICD-10-CM | POA: Diagnosis not present

## 2017-11-26 DIAGNOSIS — L821 Other seborrheic keratosis: Secondary | ICD-10-CM | POA: Diagnosis not present

## 2017-11-26 DIAGNOSIS — C44519 Basal cell carcinoma of skin of other part of trunk: Secondary | ICD-10-CM | POA: Diagnosis not present

## 2017-11-26 DIAGNOSIS — Z85828 Personal history of other malignant neoplasm of skin: Secondary | ICD-10-CM | POA: Diagnosis not present

## 2017-11-26 DIAGNOSIS — L570 Actinic keratosis: Secondary | ICD-10-CM | POA: Diagnosis not present

## 2017-11-27 ENCOUNTER — Encounter (HOSPITAL_COMMUNITY): Payer: Self-pay

## 2017-11-30 ENCOUNTER — Encounter (HOSPITAL_COMMUNITY)
Admission: RE | Admit: 2017-11-30 | Discharge: 2017-11-30 | Disposition: A | Discharge status: Home / Self Care | Payer: Self-pay | Source: Ambulatory Visit | Attending: Cardiology | Admitting: Cardiology

## 2017-12-01 ENCOUNTER — Encounter (HOSPITAL_COMMUNITY): Payer: Self-pay

## 2017-12-03 ENCOUNTER — Encounter (HOSPITAL_COMMUNITY): Payer: Self-pay

## 2017-12-04 ENCOUNTER — Encounter (HOSPITAL_COMMUNITY): Payer: Self-pay

## 2017-12-04 DIAGNOSIS — I4891 Unspecified atrial fibrillation: Secondary | ICD-10-CM | POA: Insufficient documentation

## 2017-12-07 ENCOUNTER — Encounter (HOSPITAL_COMMUNITY)
Admission: RE | Admit: 2017-12-07 | Discharge: 2017-12-07 | Disposition: A | Discharge status: Home / Self Care | Payer: Self-pay | Source: Ambulatory Visit | Attending: Cardiology | Admitting: Cardiology

## 2017-12-08 ENCOUNTER — Encounter (HOSPITAL_COMMUNITY)
Admission: RE | Admit: 2017-12-08 | Discharge: 2017-12-08 | Disposition: A | Discharge status: Home / Self Care | Payer: Self-pay | Source: Ambulatory Visit | Attending: Cardiology | Admitting: Cardiology

## 2017-12-10 ENCOUNTER — Encounter (HOSPITAL_COMMUNITY): Payer: Self-pay

## 2017-12-11 ENCOUNTER — Encounter (HOSPITAL_COMMUNITY): Payer: Self-pay

## 2017-12-14 ENCOUNTER — Encounter (HOSPITAL_COMMUNITY)
Admission: RE | Admit: 2017-12-14 | Discharge: 2017-12-14 | Disposition: A | Discharge status: Home / Self Care | Payer: Self-pay | Source: Ambulatory Visit | Attending: Cardiology | Admitting: Cardiology

## 2017-12-15 ENCOUNTER — Encounter (HOSPITAL_COMMUNITY)
Admission: RE | Admit: 2017-12-15 | Discharge: 2017-12-15 | Disposition: A | Discharge status: Home / Self Care | Payer: Self-pay | Source: Ambulatory Visit | Attending: Cardiology | Admitting: Cardiology

## 2017-12-16 ENCOUNTER — Encounter (HOSPITAL_COMMUNITY)
Admission: RE | Admit: 2017-12-16 | Discharge: 2017-12-16 | Disposition: A | Discharge status: Home / Self Care | Payer: Self-pay | Source: Ambulatory Visit | Attending: Cardiology | Admitting: Cardiology

## 2017-12-17 ENCOUNTER — Encounter (HOSPITAL_COMMUNITY): Payer: Self-pay

## 2017-12-18 ENCOUNTER — Encounter (HOSPITAL_COMMUNITY): Payer: Self-pay

## 2017-12-21 ENCOUNTER — Encounter (HOSPITAL_COMMUNITY)
Admission: RE | Admit: 2017-12-21 | Discharge: 2017-12-21 | Disposition: A | Discharge status: Home / Self Care | Payer: Self-pay | Source: Ambulatory Visit | Attending: Cardiology | Admitting: Cardiology

## 2017-12-22 ENCOUNTER — Encounter (HOSPITAL_COMMUNITY): Payer: Self-pay

## 2017-12-24 ENCOUNTER — Encounter (HOSPITAL_COMMUNITY): Payer: Self-pay

## 2017-12-25 ENCOUNTER — Encounter (HOSPITAL_COMMUNITY): Payer: Self-pay

## 2017-12-28 ENCOUNTER — Encounter (HOSPITAL_COMMUNITY)
Admission: RE | Admit: 2017-12-28 | Discharge: 2017-12-28 | Disposition: A | Discharge status: Home / Self Care | Payer: Self-pay | Source: Ambulatory Visit | Attending: Cardiology | Admitting: Cardiology

## 2017-12-29 ENCOUNTER — Encounter (HOSPITAL_COMMUNITY): Payer: Self-pay

## 2017-12-30 ENCOUNTER — Encounter (HOSPITAL_COMMUNITY)
Admission: RE | Admit: 2017-12-30 | Discharge: 2017-12-30 | Disposition: A | Discharge status: Home / Self Care | Payer: Self-pay | Source: Ambulatory Visit | Attending: Cardiology | Admitting: Cardiology

## 2018-01-05 ENCOUNTER — Encounter (HOSPITAL_COMMUNITY): Payer: Self-pay

## 2018-01-05 DIAGNOSIS — I4891 Unspecified atrial fibrillation: Secondary | ICD-10-CM | POA: Insufficient documentation

## 2018-01-07 ENCOUNTER — Encounter (HOSPITAL_COMMUNITY)
Admission: RE | Admit: 2018-01-07 | Discharge: 2018-01-07 | Disposition: A | Discharge status: Home / Self Care | Payer: Self-pay | Source: Ambulatory Visit | Attending: Cardiology | Admitting: Cardiology

## 2018-01-08 ENCOUNTER — Encounter (HOSPITAL_COMMUNITY)
Admission: RE | Admit: 2018-01-08 | Discharge: 2018-01-08 | Disposition: A | Discharge status: Home / Self Care | Payer: Self-pay | Source: Ambulatory Visit | Attending: Cardiology | Admitting: Cardiology

## 2018-01-11 ENCOUNTER — Encounter (HOSPITAL_COMMUNITY)
Admission: RE | Admit: 2018-01-11 | Discharge: 2018-01-11 | Disposition: A | Discharge status: Home / Self Care | Payer: Self-pay | Source: Ambulatory Visit | Attending: Cardiology | Admitting: Cardiology

## 2018-01-12 ENCOUNTER — Encounter (HOSPITAL_COMMUNITY)
Admission: RE | Admit: 2018-01-12 | Discharge: 2018-01-12 | Disposition: A | Discharge status: Home / Self Care | Payer: Self-pay | Source: Ambulatory Visit | Attending: Cardiology | Admitting: Cardiology

## 2018-01-14 ENCOUNTER — Encounter (HOSPITAL_COMMUNITY): Payer: Self-pay

## 2018-01-15 ENCOUNTER — Encounter (HOSPITAL_COMMUNITY)
Admission: RE | Admit: 2018-01-15 | Discharge: 2018-01-15 | Disposition: A | Discharge status: Home / Self Care | Payer: Self-pay | Source: Ambulatory Visit | Attending: Cardiology | Admitting: Cardiology

## 2018-01-18 ENCOUNTER — Encounter (HOSPITAL_COMMUNITY)
Admission: RE | Admit: 2018-01-18 | Discharge: 2018-01-18 | Disposition: A | Discharge status: Home / Self Care | Payer: Self-pay | Source: Ambulatory Visit | Attending: Cardiology | Admitting: Cardiology

## 2018-01-19 ENCOUNTER — Encounter (HOSPITAL_COMMUNITY): Payer: Self-pay

## 2018-01-21 ENCOUNTER — Encounter (HOSPITAL_COMMUNITY): Payer: Self-pay

## 2018-01-22 ENCOUNTER — Encounter (HOSPITAL_COMMUNITY)
Admission: RE | Admit: 2018-01-22 | Discharge: 2018-01-22 | Disposition: A | Discharge status: Home / Self Care | Payer: Self-pay | Source: Ambulatory Visit | Attending: Cardiology | Admitting: Cardiology

## 2018-01-25 ENCOUNTER — Encounter (HOSPITAL_COMMUNITY)
Admission: RE | Admit: 2018-01-25 | Discharge: 2018-01-25 | Disposition: A | Discharge status: Home / Self Care | Payer: Self-pay | Source: Ambulatory Visit | Attending: Cardiology | Admitting: Cardiology

## 2018-01-26 ENCOUNTER — Encounter (HOSPITAL_COMMUNITY)
Admission: RE | Admit: 2018-01-26 | Discharge: 2018-01-26 | Disposition: A | Discharge status: Home / Self Care | Payer: Self-pay | Source: Ambulatory Visit | Attending: Cardiology | Admitting: Cardiology

## 2018-01-28 ENCOUNTER — Encounter (HOSPITAL_COMMUNITY): Payer: Self-pay | Admitting: Emergency Medicine

## 2018-01-28 ENCOUNTER — Encounter (HOSPITAL_COMMUNITY): Payer: Self-pay

## 2018-01-28 ENCOUNTER — Other Ambulatory Visit: Payer: Self-pay

## 2018-01-28 ENCOUNTER — Emergency Department (HOSPITAL_COMMUNITY): Payer: Medicare Other

## 2018-01-28 ENCOUNTER — Emergency Department (HOSPITAL_COMMUNITY)
Admission: EM | Admit: 2018-01-28 | Discharge: 2018-01-28 | Disposition: A | Discharge status: Home / Self Care | Payer: Medicare Other | Attending: Emergency Medicine | Admitting: Emergency Medicine

## 2018-01-28 DIAGNOSIS — Z7901 Long term (current) use of anticoagulants: Secondary | ICD-10-CM | POA: Diagnosis not present

## 2018-01-28 DIAGNOSIS — R531 Weakness: Secondary | ICD-10-CM | POA: Insufficient documentation

## 2018-01-28 DIAGNOSIS — R002 Palpitations: Secondary | ICD-10-CM | POA: Diagnosis not present

## 2018-01-28 DIAGNOSIS — I10 Essential (primary) hypertension: Secondary | ICD-10-CM | POA: Diagnosis not present

## 2018-01-28 DIAGNOSIS — Z85828 Personal history of other malignant neoplasm of skin: Secondary | ICD-10-CM | POA: Insufficient documentation

## 2018-01-28 DIAGNOSIS — Z87891 Personal history of nicotine dependence: Secondary | ICD-10-CM | POA: Insufficient documentation

## 2018-01-28 DIAGNOSIS — I4891 Unspecified atrial fibrillation: Secondary | ICD-10-CM | POA: Insufficient documentation

## 2018-01-28 DIAGNOSIS — R000 Tachycardia, unspecified: Secondary | ICD-10-CM | POA: Diagnosis not present

## 2018-01-28 LAB — CBC
HCT: 48.2 % (ref 39.0–52.0)
HEMOGLOBIN: 15.8 g/dL (ref 13.0–17.0)
MCH: 30.5 pg (ref 26.0–34.0)
MCHC: 32.8 g/dL (ref 30.0–36.0)
MCV: 93.1 fL (ref 80.0–100.0)
PLATELETS: 268 10*3/uL (ref 150–400)
RBC: 5.18 MIL/uL (ref 4.22–5.81)
RDW: 14.1 % (ref 11.5–15.5)
WBC: 9.9 10*3/uL (ref 4.0–10.5)
nRBC: 0 % (ref 0.0–0.2)

## 2018-01-28 LAB — BASIC METABOLIC PANEL
ANION GAP: 7 (ref 5–15)
BUN: 23 mg/dL (ref 8–23)
CALCIUM: 9.1 mg/dL (ref 8.9–10.3)
CO2: 28 mmol/L (ref 22–32)
Chloride: 105 mmol/L (ref 98–111)
Creatinine, Ser: 1.49 mg/dL — ABNORMAL HIGH (ref 0.61–1.24)
GFR calc Af Amer: 51 mL/min — ABNORMAL LOW (ref >60)
GFR calc non Af Amer: 44 mL/min — ABNORMAL LOW (ref >60)
GLUCOSE: 103 mg/dL — AB (ref 70–99)
POTASSIUM: 4.3 mmol/L (ref 3.5–5.1)
Sodium: 140 mmol/L (ref 135–145)

## 2018-01-28 LAB — I-STAT TROPONIN, ED: TROPONIN I, POC: 0 ng/mL (ref 0.00–0.08)

## 2018-01-28 NOTE — Discharge Instructions (Addendum)
Your heart palpitation is likely due to atrial fibrillation.  Please call and follow-up with your cardiologist for further management.  Avoid caffeinated drink at this time.

## 2018-01-28 NOTE — ED Provider Notes (Signed)
[facility] EMERGENCY DEPARTMENT Provider Note   CSN: [id number] Arrival date & time: 01/28/18  1659     History   Chief Complaint Chief Complaint  Patient presents with  . Abnormal ECG    HPI Matthew Cannon is a 77 y.o. male.  The history is provided by the patient and medical records. No language interpreter was used.     77 year old male with history of paroxysmal atrial fibrillation currently on metoprolol, and Xarelto presenting to ED for evaluation of heart palpitation.  Patient report a few hours ago he was driving home from a friend's house that he was spending time for Christmas when he develop heart palpitation and slight generalized weakness.  Once he got home, he checked his heart rate and it was in the 160s.  He reached out to his cardiologist who recommend patient to take his metoprolol and come to the ER.  He took his medication and while waiting in the ED, his heart rate resolved and now he is back to his normal baseline.  He denies any associated chest pain or shortness of breath.  He does admits to drinking several cups of caffeinated coffee this morning at his friend's house, which he normally does not drink caffeinated drink.  He denies any other medication changes or dietary changes.  He has been compliant with his Xarelto, and he takes metoprolol only as needed.  Past Medical History:  Diagnosis Date  . Cancer ([place])    Melanoma- forehead and cheek - left side of face  . Dysrhythmia    PAF  . Hypertension    not on medications-   . Left atrial dilatation   . PAF (paroxysmal atrial fibrillation) ([place])   . Sinus bradycardia   . Sleep apnea    uses CPAP  . Syncope     Patient Active Problem List   Diagnosis Date Noted  . OSA on CPAP 08/20/2015  . Bradycardia 07/30/2015  . Snoring 04/23/2015  . Hypersomnia with sleep apnea 04/23/2015  . Paroxysmal atrial fibrillation ([place]) 04/23/2015  . Nocturia more than twice per night  04/23/2015  . Epistaxis 04/23/2015    Past Surgical History:  Procedure Laterality Date  . ADENOIDECTOMY    . COLONOSCOPY    . COLONOSCOPY W/ POLYPECTOMY    . DISTAL BICEPS TENDON REPAIR Right 05/18/2017   Procedure: DISTAL BICEPS TENDON REPAIR;  Surgeon: Iran Planas, MD;  Location: [facility];  Service: Orthopedics;  Laterality: Right;  . FOOT SURGERY Left    had bone spur  . INGUINAL HERNIA REPAIR Bilateral 12/15/2012   Procedure: LAPAROSCOPIC BILATERAL INGUINAL HERNIA LEFT DIRECT AND INDIRECT HERNIA  AND RIGHT DIRECT HERNIA WITH MESH;  Surgeon: Gayland Curry, MD;  [facility];  Service: General;  Laterality: Bilateral;  . INSERTION OF MESH Bilateral 12/15/2012   Procedure: INSERTION OF MESH BILATERAL;  Surgeon: Gayland Curry, MD;  [facility];  Service: General;  Laterality: Bilateral;  INGUINAL  . melanoma removal  2016   forehead, left face cheek  . TONSILLECTOMY    . VASECTOMY          Home Medications    Prior to Admission medications   Medication Sig Start Date End Date Taking? Authorizing Provider  metoprolol tartrate (LOPRESSOR) 25 MG tablet Take 12.5 mg by mouth 2 (two) times daily as needed (afib).    Yes [provider]  rivaroxaban (XARELTO) 20 MG TABS tablet Take 20 mg by mouth daily with supper.  Yes [provider]    Family History Family History  Problem Relation Age of Onset  . Suicidality Father   . Alcohol abuse Father   . Cancer Daughter        breast    Social History Social History   Tobacco Use  . Smoking status: Former Smoker    Years: 23.00    Last attempt to quit: 03/06/1981    Years since quitting: 36.9  . Smokeless tobacco: Never Used  Substance Use Topics  . Alcohol use: Yes    Alcohol/week: 4.0 standard drinks    Types: 1 Standard drinks or equivalent, 3 Glasses of wine per week    Comment: 3 x week, 1-2 wine or a beer, on Friday nights a Gin Martini  . Drug use: No     Allergies   Zithromax  [azithromycin]   Review of Systems Review of Systems  All other systems reviewed and are negative.    Physical Exam Updated Vital Signs BP (!) 155/93   Pulse 68   Temp 98 F (36.7 C) (Oral)   Resp 16   SpO2 98%   Physical Exam Vitals signs and nursing note reviewed.  Constitutional:      General: He is not in acute distress.    Appearance: He is well-developed.  HENT:     Head: Atraumatic.  Eyes:     Conjunctiva/sclera: Conjunctivae normal.  Neck:     Musculoskeletal: Neck supple.  Cardiovascular:     Comments: Irregularly irregular heart rhythm without murmur rubs or gallops Pulmonary:     Effort: Pulmonary effort is normal.     Breath sounds: Normal breath sounds.  Abdominal:     Palpations: Abdomen is soft.     Tenderness: There is no abdominal tenderness.  Skin:    Findings: No rash.  Neurological:     Mental Status: He is alert and oriented to person, place, and time.      ED Treatments / Results  Labs (all labs ordered are listed, but only abnormal results are displayed) Labs Reviewed  BASIC METABOLIC PANEL - Abnormal; Notable for the following components:      Result Value   Glucose, Bld 103 (*)    Creatinine, Ser 1.49 (*)    GFR calc non Af Amer 44 (*)    GFR calc Af Amer 51 (*)    All other components within normal limits  CBC  I-STAT TROPONIN, ED    EKG EKG Interpretation  Date/Time:  Thursday January 28 2018 17:26:30 EST Ventricular Rate:  66 PR Interval:  180 QRS Duration: 88 QT Interval:  356 QTC Calculation: 373 R Axis:   21 Text Interpretation:  Normal sinus rhythm with sinus arrhythmia Normal ECG Confirmed by Quintella Reichert [phone]) on 01/28/2018 8:57:28 PM   Radiology Dg Chest Port 1 View  Result Date: 01/28/2018 CLINICAL DATA:  Tachycardia with abnormal electrocardiogram EXAM: PORTABLE CHEST 1 VIEW COMPARISON:  April 03, 2016 FINDINGS: There is no evident edema or consolidation. The heart is upper normal in size with  pulmonary vascularity normal. No adenopathy. There is aortic atherosclerosis. No bone lesions. IMPRESSION: No edema or consolidation. Stable cardiac silhouette. There is aortic atherosclerosis. Aortic Atherosclerosis (ICD10-I70.0). Electronically Signed   By: Lowella Grip III M.D.   On: 01/28/2018 20:07    Procedures Procedures (including critical care time)  Medications Ordered in ED Medications - No data to display   Initial Impression / Assessment and Plan / ED Course  I  have reviewed the triage vital signs and the nursing notes.  Pertinent labs & imaging results that were available during my care of the patient were reviewed by me and considered in my medical decision making (see chart for details).     BP (!) 154/84   Pulse (!) 58   Temp 98 F (36.7 C) (Oral)   Resp 16   SpO2 99%    Final Clinical Impressions(s) / ED Diagnoses   Final diagnoses:  Heart palpitations    ED Discharge Orders    None     8:09 PM Patient with history of paroxysmal atrial fibrillation who had an apparent A. fib with RVR earlier in the day which has since resolved after he takes as metoprolol.  He is currently in no acute discomfort.  Will perform screening labs, and will monitor closely.  He is resting comfortably.  No chest pain or shortness of breath.  9:15 PM EKG chest x-ray and labs are reassuring.  I discussed care with Dr. Ralene Bathe who has seen and evaluate patient.  I also reach out and spoke with patient's cardiologist, Dr. Einar Gip who felt the patient is stable for discharge and can follow-up outpatient for further care.   Domenic Moras, PA-C 01/28/18 2147    Quintella Reichert, MD 01/30/18 1520

## 2018-01-28 NOTE — ED Triage Notes (Signed)
Pt reports hx of A-fib. Pt reports feeling light-headed, he checked his HR, it was 150. Pt reports he took 25 mg metoprolol. Pt reports he called his cardiologist and they told him to come in. Pt denies CP. Pt reports as he checked in his heart rate went down to 78. Pt's HR currently 68. Pt in no acute distress at this time.

## 2018-01-29 ENCOUNTER — Encounter (HOSPITAL_COMMUNITY): Payer: Self-pay

## 2018-02-01 ENCOUNTER — Encounter (HOSPITAL_COMMUNITY)
Admission: RE | Admit: 2018-02-01 | Discharge: 2018-02-01 | Disposition: A | Discharge status: Home / Self Care | Payer: Self-pay | Source: Ambulatory Visit | Attending: Cardiology | Admitting: Cardiology

## 2018-02-02 ENCOUNTER — Encounter (HOSPITAL_COMMUNITY): Payer: Self-pay

## 2018-02-04 ENCOUNTER — Encounter (HOSPITAL_COMMUNITY)
Admission: RE | Admit: 2018-02-04 | Discharge: 2018-02-04 | Disposition: A | Discharge status: Home / Self Care | Payer: Self-pay | Source: Ambulatory Visit | Attending: Cardiology | Admitting: Cardiology

## 2018-02-04 DIAGNOSIS — I4891 Unspecified atrial fibrillation: Secondary | ICD-10-CM | POA: Insufficient documentation

## 2018-02-04 DIAGNOSIS — R001 Bradycardia, unspecified: Secondary | ICD-10-CM | POA: Diagnosis not present

## 2018-02-04 DIAGNOSIS — I480 Paroxysmal atrial fibrillation: Secondary | ICD-10-CM | POA: Diagnosis not present

## 2018-02-04 DIAGNOSIS — G4730 Sleep apnea, unspecified: Secondary | ICD-10-CM | POA: Diagnosis not present

## 2018-02-04 DIAGNOSIS — R0989 Other specified symptoms and signs involving the circulatory and respiratory systems: Secondary | ICD-10-CM | POA: Diagnosis not present

## 2018-02-05 ENCOUNTER — Encounter (HOSPITAL_COMMUNITY): Payer: Self-pay

## 2018-02-09 ENCOUNTER — Encounter (HOSPITAL_COMMUNITY)
Admission: RE | Admit: 2018-02-09 | Discharge: 2018-02-09 | Disposition: A | Discharge status: Home / Self Care | Payer: Self-pay | Source: Ambulatory Visit | Attending: Cardiology | Admitting: Cardiology

## 2018-02-10 ENCOUNTER — Encounter (HOSPITAL_COMMUNITY)
Admission: RE | Admit: 2018-02-10 | Discharge: 2018-02-10 | Disposition: A | Discharge status: Home / Self Care | Payer: Self-pay | Source: Ambulatory Visit | Attending: Cardiology | Admitting: Cardiology

## 2018-02-11 ENCOUNTER — Encounter (HOSPITAL_COMMUNITY): Payer: Self-pay

## 2018-02-11 DIAGNOSIS — H35033 Hypertensive retinopathy, bilateral: Secondary | ICD-10-CM | POA: Diagnosis not present

## 2018-02-11 DIAGNOSIS — H2513 Age-related nuclear cataract, bilateral: Secondary | ICD-10-CM | POA: Diagnosis not present

## 2018-02-11 DIAGNOSIS — H25013 Cortical age-related cataract, bilateral: Secondary | ICD-10-CM | POA: Diagnosis not present

## 2018-02-11 DIAGNOSIS — H02833 Dermatochalasis of right eye, unspecified eyelid: Secondary | ICD-10-CM | POA: Diagnosis not present

## 2018-02-12 ENCOUNTER — Encounter (HOSPITAL_COMMUNITY): Payer: Self-pay

## 2018-02-16 ENCOUNTER — Encounter (HOSPITAL_COMMUNITY)
Admission: RE | Admit: 2018-02-16 | Discharge: 2018-02-16 | Disposition: A | Discharge status: Home / Self Care | Payer: Self-pay | Source: Ambulatory Visit | Attending: Cardiology | Admitting: Cardiology

## 2018-02-18 ENCOUNTER — Encounter (HOSPITAL_COMMUNITY)
Admission: RE | Admit: 2018-02-18 | Discharge: 2018-02-18 | Disposition: A | Discharge status: Home / Self Care | Payer: Self-pay | Source: Ambulatory Visit | Attending: Cardiology | Admitting: Cardiology

## 2018-02-19 ENCOUNTER — Encounter (HOSPITAL_COMMUNITY)
Admission: RE | Admit: 2018-02-19 | Discharge: 2018-02-19 | Disposition: A | Discharge status: Home / Self Care | Payer: Self-pay | Source: Ambulatory Visit | Attending: Cardiology | Admitting: Cardiology

## 2018-02-22 ENCOUNTER — Encounter (HOSPITAL_COMMUNITY)
Admission: RE | Admit: 2018-02-22 | Discharge: 2018-02-22 | Disposition: A | Discharge status: Home / Self Care | Payer: Self-pay | Source: Ambulatory Visit | Attending: Cardiology | Admitting: Cardiology

## 2018-02-23 ENCOUNTER — Encounter (HOSPITAL_COMMUNITY)
Admission: RE | Admit: 2018-02-23 | Discharge: 2018-02-23 | Disposition: A | Discharge status: Home / Self Care | Payer: Self-pay | Source: Ambulatory Visit | Attending: Cardiology | Admitting: Cardiology

## 2018-02-25 ENCOUNTER — Encounter (HOSPITAL_COMMUNITY): Payer: Self-pay

## 2018-02-26 ENCOUNTER — Encounter (HOSPITAL_COMMUNITY)
Admission: RE | Admit: 2018-02-26 | Discharge: 2018-02-26 | Disposition: A | Discharge status: Home / Self Care | Payer: Self-pay | Source: Ambulatory Visit | Attending: Cardiology | Admitting: Cardiology

## 2018-03-01 ENCOUNTER — Encounter (HOSPITAL_COMMUNITY)
Admission: RE | Admit: 2018-03-01 | Discharge: 2018-03-01 | Disposition: A | Discharge status: Home / Self Care | Payer: Self-pay | Source: Ambulatory Visit | Attending: Internal Medicine | Admitting: Internal Medicine

## 2018-03-02 ENCOUNTER — Encounter (HOSPITAL_COMMUNITY)
Admission: RE | Admit: 2018-03-02 | Discharge: 2018-03-02 | Disposition: A | Discharge status: Home / Self Care | Payer: Self-pay | Source: Ambulatory Visit | Attending: Cardiology | Admitting: Cardiology

## 2018-03-04 ENCOUNTER — Encounter (HOSPITAL_COMMUNITY)
Admission: RE | Admit: 2018-03-04 | Discharge: 2018-03-04 | Disposition: A | Discharge status: Home / Self Care | Payer: Self-pay | Source: Ambulatory Visit | Attending: Cardiology | Admitting: Cardiology

## 2018-03-05 ENCOUNTER — Encounter (HOSPITAL_COMMUNITY): Payer: Self-pay

## 2018-03-09 ENCOUNTER — Encounter (HOSPITAL_COMMUNITY): Payer: Self-pay | Attending: Cardiology

## 2018-03-11 ENCOUNTER — Encounter (HOSPITAL_COMMUNITY): Payer: Self-pay

## 2018-03-12 ENCOUNTER — Encounter (HOSPITAL_COMMUNITY): Payer: Self-pay

## 2018-03-15 ENCOUNTER — Telehealth (HOSPITAL_COMMUNITY): Payer: Self-pay | Admitting: *Deleted

## 2018-03-15 NOTE — Telephone Encounter (Signed)
Received message from departmental answering machine that Mr. Matthew Cannon would like to withdraw from Cardiac Rehab Maintenance program effective today-03/15/2018.  Cardiac Rehab Maintenance coordinator notified.  Cherre Huger, BSN Cardiac and Training and development officer

## 2018-03-16 ENCOUNTER — Encounter (HOSPITAL_COMMUNITY): Payer: Self-pay

## 2018-03-18 ENCOUNTER — Encounter (HOSPITAL_COMMUNITY): Payer: Self-pay

## 2018-03-19 ENCOUNTER — Encounter (HOSPITAL_COMMUNITY): Payer: Self-pay

## 2018-03-23 ENCOUNTER — Encounter (HOSPITAL_COMMUNITY): Payer: Self-pay

## 2018-03-25 ENCOUNTER — Encounter (HOSPITAL_COMMUNITY): Payer: Self-pay

## 2018-03-26 ENCOUNTER — Encounter (HOSPITAL_COMMUNITY): Payer: Self-pay

## 2018-03-30 ENCOUNTER — Encounter (HOSPITAL_COMMUNITY): Payer: Self-pay

## 2018-03-30 DIAGNOSIS — R739 Hyperglycemia, unspecified: Secondary | ICD-10-CM | POA: Diagnosis not present

## 2018-03-30 DIAGNOSIS — I480 Paroxysmal atrial fibrillation: Secondary | ICD-10-CM | POA: Diagnosis not present

## 2018-03-30 DIAGNOSIS — I10 Essential (primary) hypertension: Secondary | ICD-10-CM | POA: Diagnosis not present

## 2018-03-30 DIAGNOSIS — Z125 Encounter for screening for malignant neoplasm of prostate: Secondary | ICD-10-CM | POA: Diagnosis not present

## 2018-04-01 ENCOUNTER — Encounter (HOSPITAL_COMMUNITY): Payer: Self-pay

## 2018-04-02 ENCOUNTER — Encounter (HOSPITAL_COMMUNITY): Payer: Self-pay

## 2018-04-06 ENCOUNTER — Encounter (HOSPITAL_COMMUNITY): Payer: Self-pay

## 2018-04-08 ENCOUNTER — Encounter (HOSPITAL_COMMUNITY): Payer: Self-pay

## 2018-04-09 ENCOUNTER — Encounter (HOSPITAL_COMMUNITY): Payer: Self-pay

## 2018-04-09 DIAGNOSIS — Z125 Encounter for screening for malignant neoplasm of prostate: Secondary | ICD-10-CM | POA: Diagnosis not present

## 2018-04-09 DIAGNOSIS — I10 Essential (primary) hypertension: Secondary | ICD-10-CM | POA: Diagnosis not present

## 2018-04-09 DIAGNOSIS — I480 Paroxysmal atrial fibrillation: Secondary | ICD-10-CM | POA: Diagnosis not present

## 2018-04-09 DIAGNOSIS — Z0000 Encounter for general adult medical examination without abnormal findings: Secondary | ICD-10-CM | POA: Diagnosis not present

## 2018-04-13 ENCOUNTER — Encounter (HOSPITAL_COMMUNITY): Payer: Self-pay

## 2018-04-15 ENCOUNTER — Encounter (HOSPITAL_COMMUNITY): Payer: Self-pay

## 2018-04-16 ENCOUNTER — Encounter (HOSPITAL_COMMUNITY): Payer: Self-pay

## 2018-04-20 ENCOUNTER — Encounter (HOSPITAL_COMMUNITY): Payer: Self-pay

## 2018-04-22 ENCOUNTER — Encounter (HOSPITAL_COMMUNITY): Payer: Self-pay

## 2018-04-23 ENCOUNTER — Encounter (HOSPITAL_COMMUNITY): Payer: Self-pay

## 2018-04-27 ENCOUNTER — Encounter (HOSPITAL_COMMUNITY): Payer: Self-pay

## 2018-04-29 ENCOUNTER — Encounter (HOSPITAL_COMMUNITY): Payer: Self-pay

## 2018-04-30 ENCOUNTER — Encounter (HOSPITAL_COMMUNITY): Payer: Self-pay

## 2018-05-04 ENCOUNTER — Encounter (HOSPITAL_COMMUNITY): Payer: Self-pay

## 2018-05-06 ENCOUNTER — Encounter (HOSPITAL_COMMUNITY): Payer: Self-pay

## 2018-05-07 ENCOUNTER — Encounter (HOSPITAL_COMMUNITY): Payer: Self-pay

## 2018-05-11 ENCOUNTER — Encounter (HOSPITAL_COMMUNITY): Payer: Self-pay

## 2018-05-13 ENCOUNTER — Encounter (HOSPITAL_COMMUNITY): Payer: Self-pay

## 2018-05-14 ENCOUNTER — Encounter (HOSPITAL_COMMUNITY): Payer: Self-pay

## 2018-05-18 ENCOUNTER — Encounter (HOSPITAL_COMMUNITY): Payer: Self-pay

## 2018-05-20 ENCOUNTER — Encounter (HOSPITAL_COMMUNITY): Payer: Self-pay

## 2018-05-21 ENCOUNTER — Encounter (HOSPITAL_COMMUNITY): Payer: Self-pay

## 2018-05-25 ENCOUNTER — Encounter (HOSPITAL_COMMUNITY): Payer: Self-pay

## 2018-05-26 DIAGNOSIS — G4733 Obstructive sleep apnea (adult) (pediatric): Secondary | ICD-10-CM | POA: Diagnosis not present

## 2018-05-27 ENCOUNTER — Encounter (HOSPITAL_COMMUNITY): Payer: Self-pay

## 2018-05-28 ENCOUNTER — Encounter (HOSPITAL_COMMUNITY): Payer: Self-pay

## 2018-06-01 ENCOUNTER — Encounter (HOSPITAL_COMMUNITY): Payer: Self-pay

## 2018-06-03 ENCOUNTER — Encounter (HOSPITAL_COMMUNITY): Payer: Self-pay

## 2018-06-04 ENCOUNTER — Encounter (HOSPITAL_COMMUNITY): Payer: Self-pay

## 2018-06-08 ENCOUNTER — Encounter (HOSPITAL_COMMUNITY): Payer: Self-pay

## 2018-06-10 ENCOUNTER — Encounter (HOSPITAL_COMMUNITY): Payer: Self-pay

## 2018-06-11 ENCOUNTER — Encounter (HOSPITAL_COMMUNITY): Payer: Self-pay

## 2018-06-15 ENCOUNTER — Encounter (HOSPITAL_COMMUNITY): Payer: Self-pay

## 2018-06-17 ENCOUNTER — Encounter (HOSPITAL_COMMUNITY): Payer: Self-pay

## 2018-06-18 ENCOUNTER — Encounter (HOSPITAL_COMMUNITY): Payer: Self-pay

## 2018-06-22 ENCOUNTER — Encounter (HOSPITAL_COMMUNITY): Payer: Self-pay

## 2018-06-24 ENCOUNTER — Encounter (HOSPITAL_COMMUNITY): Payer: Self-pay

## 2018-06-25 ENCOUNTER — Encounter (HOSPITAL_COMMUNITY): Payer: Self-pay

## 2018-06-29 ENCOUNTER — Encounter (HOSPITAL_COMMUNITY): Payer: Self-pay

## 2018-07-01 ENCOUNTER — Encounter (HOSPITAL_COMMUNITY): Payer: Self-pay

## 2018-07-02 ENCOUNTER — Encounter (HOSPITAL_COMMUNITY): Payer: Self-pay

## 2018-07-06 ENCOUNTER — Encounter (HOSPITAL_COMMUNITY): Payer: Self-pay

## 2018-07-08 ENCOUNTER — Encounter (HOSPITAL_COMMUNITY): Payer: Self-pay

## 2018-07-09 ENCOUNTER — Encounter (HOSPITAL_COMMUNITY): Payer: Self-pay

## 2018-07-13 ENCOUNTER — Encounter (HOSPITAL_COMMUNITY): Payer: Self-pay

## 2018-07-15 ENCOUNTER — Encounter (HOSPITAL_COMMUNITY): Payer: Self-pay

## 2018-07-16 ENCOUNTER — Encounter (HOSPITAL_COMMUNITY): Payer: Self-pay

## 2018-07-20 ENCOUNTER — Encounter (HOSPITAL_COMMUNITY): Payer: Self-pay

## 2018-07-22 ENCOUNTER — Encounter (HOSPITAL_COMMUNITY): Payer: Self-pay

## 2018-07-23 ENCOUNTER — Encounter (HOSPITAL_COMMUNITY): Payer: Self-pay

## 2018-07-27 ENCOUNTER — Encounter (HOSPITAL_COMMUNITY): Payer: Self-pay

## 2018-07-29 ENCOUNTER — Encounter (HOSPITAL_COMMUNITY): Payer: Self-pay

## 2018-07-30 ENCOUNTER — Encounter (HOSPITAL_COMMUNITY): Payer: Self-pay

## 2018-08-03 ENCOUNTER — Encounter (HOSPITAL_COMMUNITY): Payer: Self-pay

## 2018-08-05 ENCOUNTER — Encounter (HOSPITAL_COMMUNITY): Payer: Self-pay

## 2018-08-05 ENCOUNTER — Encounter: Payer: Self-pay | Admitting: Cardiology

## 2018-08-09 ENCOUNTER — Encounter: Payer: Self-pay | Admitting: Cardiology

## 2018-08-09 ENCOUNTER — Ambulatory Visit: Payer: Medicare Other | Admitting: Cardiology

## 2018-08-09 ENCOUNTER — Other Ambulatory Visit: Payer: Self-pay

## 2018-08-09 VITALS — BP 134/73 | HR 58 | Ht 70.0 in | Wt 190.0 lb

## 2018-08-09 DIAGNOSIS — I480 Paroxysmal atrial fibrillation: Secondary | ICD-10-CM | POA: Diagnosis not present

## 2018-08-09 DIAGNOSIS — N183 Chronic kidney disease, stage 3 unspecified: Secondary | ICD-10-CM

## 2018-08-09 DIAGNOSIS — R0989 Other specified symptoms and signs involving the circulatory and respiratory systems: Secondary | ICD-10-CM | POA: Diagnosis not present

## 2018-08-09 DIAGNOSIS — I495 Sick sinus syndrome: Secondary | ICD-10-CM | POA: Insufficient documentation

## 2018-08-09 HISTORY — DX: Sick sinus syndrome: I49.5

## 2018-08-09 MED ORDER — RIVAROXABAN 15 MG PO TABS: 15.0000 mg | ORAL_TABLET | Freq: Every day | ORAL | 3 refills | Status: DC

## 2018-08-09 NOTE — Progress Notes (Signed)
Virtual Visit via Video Note: This visit type was conducted due to national recommendations for restrictions regarding the COVID-19 Pandemic (e.g. social distancing).  This format is felt to be most appropriate for this patient at this time.  All issues noted in this document were discussed and addressed.  No physical exam was performed (except for noted visual exam findings with Telehealth visits).  The patient has consented to conduct a Telehealth visit and understands insurance will be billed.   I connected with@, on 08/09/18 at  by a video enabled telemedicine application and verified that I am speaking with the correct person using two identifiers.   I discussed the limitations of evaluation and management by telemedicine and the availability of in person appointments. The patient expressed understanding and agreed to proceed.   I have discussed with patient regarding the safety during COVID Pandemic and steps and precautions to be taken including social distancing, frequent hand wash and use of detergent soap, gels with the patient. I asked the patient to avoid touching mouth, nose, eyes, ears with the hands. I encouraged regular walking around the neighborhood and exercise and regular diet, as long as social distancing can be maintained.  Primary Physician/Referring:  Jani Gravel, MD  Patient ID: Matthew Cannon, male    DOB: [birth year], 78 y.o.   MRN: [id number]  Chief Complaint  Patient presents with  . Atrial Fibrillation  . Tachycardia  . Follow-up    6 mth    HPI: Matthew Cannon  is a 78 y.o. male  Caucasian male with paroxysmal atrial fibrillation, obstructive sleep apnea and compliant with CPAP and follows Dr. Brett Fairy, has been on Xarelto since 2016, was seen in the emergency room on 01/28/2018 with recurrent episode of atrial fibrillation that started at home, in the emergency room he spontaneously converted back to sinus rhythm and discharged home. He now presents here  for follow-up.   He essentially remains asymptomatic. He was evaluated by EP in the past due to underlying bradycardia he was recommended pacemaker implantation, however he wanted to wait. He is now back on taking metoprolol 12.5 mg b.i.d. Previously could not tolerate this due to marked fatigue. Today he remains asymptomatic. No history to suggest GI bleeding.  Past Medical History:  Diagnosis Date  . Cancer ([place])    Melanoma- forehead and cheek - left side of face  . Left atrial dilatation   . Sinus bradycardia   . Syncope   . Tachycardia-bradycardia syndrome ([place]) 08/09/2018    Past Surgical History:  Procedure Laterality Date  . ADENOIDECTOMY    . COLONOSCOPY    . COLONOSCOPY W/ POLYPECTOMY    . DISTAL BICEPS TENDON REPAIR Right 05/18/2017   Procedure: DISTAL BICEPS TENDON REPAIR;  Surgeon: Iran Planas, MD;  Location: [facility];  Service: Orthopedics;  Laterality: Right;  . FOOT SURGERY Left    had bone spur  . INGUINAL HERNIA REPAIR Bilateral 12/15/2012   Procedure: LAPAROSCOPIC BILATERAL INGUINAL HERNIA LEFT DIRECT AND INDIRECT HERNIA  AND RIGHT DIRECT HERNIA WITH MESH;  Surgeon: Gayland Curry, MD;  Location[facility];  Service: General;  Laterality: Bilateral;  . INSERTION OF MESH Bilateral 12/15/2012   Procedure: INSERTION OF MESH BILATERAL;  Surgeon: Gayland Curry, MD;  Location: [facility];  Service: General;  Laterality: Bilateral;  INGUINAL  . melanoma removal  2016   forehead, left face cheek  . TONSILLECTOMY    . VASECTOMY      Social History  Socioeconomic History  . Marital status: Married    Spouse name: Not on file  . Number of children: 4  . Years of education: Not on file  . Highest education level: Not on file  Occupational History  . Not on file  Social Needs  . Financial resource strain: Not on file  . Food insecurity    Worry: Not on file    Inability: Not on file  . Transportation needs    Medical: Not on file    Non-medical: Not on file  Tobacco  Use  . Smoking status: Former Smoker    Years: 23.00    Quit date: 03/06/1981    Years since quitting: 37.4  . Smokeless tobacco: Never Used  Substance and Sexual Activity  . Alcohol use: Yes    Alcohol/week: 4.0 standard drinks    Types: 1 Standard drinks or equivalent, 3 Glasses of wine per week    Comment: 3 x week, 1-2 wine or a beer, on Friday nights a Gin Martini  . Drug use: No  . Sexual activity: Not on file  Lifestyle  . Physical activity    Days per week: Not on file    Minutes per session: Not on file  . Stress: Not on file  Relationships  . Social Herbalist on phone: Not on file    Gets together: Not on file    Attends religious service: Not on file    Active member of club or organization: Not on file    Attends meetings of clubs or organizations: Not on file    Relationship status: Not on file  . Intimate partner violence    Fear of current or ex partner: Not on file    Emotionally abused: Not on file    Physically abused: Not on file    Forced sexual activity: Not on file  Other Topics Concern  . Not on file  Social History Narrative   Lives in [place] with spouse.  Retired from Radiation protection practitioner    Review of Systems  Constitution: Negative for chills, decreased appetite, malaise/fatigue and weight gain.  Cardiovascular: Negative for dyspnea on exertion, leg swelling and syncope.  Endocrine: Negative for cold intolerance.  Hematologic/Lymphatic: Does not bruise/bleed easily.  Musculoskeletal: Negative for joint swelling.  Gastrointestinal: Negative for abdominal pain, anorexia, change in bowel habit, hematochezia and melena.  Neurological: Negative for headaches and light-headedness.  Psychiatric/Behavioral: Negative for depression and substance abuse.  All other systems reviewed and are negative.   Objective  Blood pressure 134/73, pulse (!) 58, height 5' 10"  (1.778 m), weight 190 lb (86.2 kg). Body mass index is 27.26 kg/m.   Physical exam  not performed or limited due to virtual visit.  Patient appeared to be in no distress, Neck was supple, respiration was not labored.  Please see exam details from prior visit is as below.  Physical Exam  Constitutional: He appears well-developed and well-nourished. No distress.  HENT:  Head: Atraumatic.  Eyes: Conjunctivae are normal.  Neck: Neck supple. No JVD present. No thyromegaly present.  Cardiovascular: Normal rate, regular rhythm, normal heart sounds, intact distal pulses and normal pulses. Exam reveals no gallop.  No murmur heard. Pulses:      Carotid pulses are on the right side with bruit and on the left side with bruit. Pulmonary/Chest: Effort normal and breath sounds normal.  Abdominal: Soft. Bowel sounds are normal.  Musculoskeletal: Normal range of motion.  Neurological: He is alert.  Skin:  Skin is warm and dry.  Psychiatric: He has a normal mood and affect.   Radiology: No results found.  Laboratory examination:   CMP Latest Ref Rng & Units 01/28/2018 05/13/2017 12/10/2012  Glucose 70 - 99 mg/dL 103(H) 95 100(H)  BUN 8 - 23 mg/dL 23 19 29(H)  Creatinine 0.61 - 1.24 mg/dL 1.49(H) 1.37(H) 1.33  Sodium 135 - 145 mmol/L 140 139 136  Potassium 3.5 - 5.1 mmol/L 4.3 4.4 4.7  Chloride 98 - 111 mmol/L 105 105 102  CO2 22 - 32 mmol/L 28 22 25   Calcium 8.9 - 10.3 mg/dL 9.1 9.4 10.2   CBC Latest Ref Rng & Units 01/28/2018 05/13/2017 12/10/2012  WBC 4.0 - 10.5 K/uL 9.9 8.3 7.1  Hemoglobin 13.0 - 17.0 g/dL 15.8 15.4 15.7  Hematocrit 39.0 - 52.0 % 48.2 46.3 47.2  Platelets 150 - 400 K/uL 268 237 268   Lipid Panel  No results found for: CHOL, TRIG, HDL, CHOLHDL, VLDL, LDLCALC, LDLDIRECT HEMOGLOBIN A1C No results found for: HGBA1C, MPG TSH No results for input(s): TSH in the last 8760 hours.   Medications   Current Outpatient Medications  Medication Instructions  . metoprolol tartrate (LOPRESSOR) 12.5 mg, Oral, 2 times daily PRN  . Rivaroxaban (XARELTO) 15 mg, Oral,  Daily with supper    Cardiac Studies:   Echocardiogram 03/20/2015: Left ventricle cavity is normal in size. Mild concentric hypertrophy of the left ventricle. Normal global wall motion. Doppler evidence of grade I (impaired) diastolic dysfunction. Calculated EF 55%. Left atrial cavity is moderately dilated at 4.8 cm. Mild prolapse of the mitral valve leaflets. No significant myxomatous changes noted. Mild mitral regurgitation. Mild tricuspid regurgitation. No evidence of pulmonary hypertension. Mild pulmonic regurgitation.  Treadmill exercise stress test 03/19/2015: Indication: Syncope The patient exercised according to Bruce Protocol, Total time recorded 9:09 min achieving max heart rate of 148which was 102% of MPHR for age and 10.16 METS of work. Resting ECG showing NSR. Normal BP response. There was no ST-T changes of ischemia with exercise stress test. Stress terminated due to [facility] met. and mild dyspnea. No arrhythmias. Excellent effort.  Assessment   Paroxysmal atrial fibrillation (HCC) - CHA2DS2-VASc Score is 3.  Yearly risk of stroke: 3.2% - Plan: Rivaroxaban (XARELTO) 15 MG TABS tablet,   Tachycardia-bradycardia syndrome ([facility])   Bilateral carotid bruits - Bialteral external carotid stenosis, no significant ICA stenosis 10/29/17.   Stage 3 chronic kidney disease ([place])   EKG 02/04/2018: Marked sinus bradycardia at rate of 45 beats minute with sinus arrhythmia. Otherwise normal EKG. No significant change from EKG 09/14/2017. Frequent PACs, multifocal no longer present.  Recommendations:   77 YO Caucasian male with paroxysmal atrial fibrillation, obstructive sleep apnea and compliant with CPAP and follows Dr. Brett Fairy, has been on Xarelto since 2016, was seen in the emergency room on 01/28/2018 with recurrent episode of atrial fibrillation that started at home, in the emergency room he spontaneously converted back to sinus rhythm and discharged home. He now presents here for  6 month follow-up.    He essentially remains asymptomatic. He was evaluated by EP in the past due to underlying bradycardia he was recommended pacemaker implantation, however he wanted to wait.   He has no dizziness, no syncope, no decreased exercise tolerance.  Do not suspect asymptomatic sinus bradycardia to be an issue, continued observation.  Due to carotid bruit and mild carotid stenosis although external carotid, his age, his cardio-embolic risk is 3 hence continue anticoagulation.  I also discussed with  him regarding chronic renal insufficiency which is remained stable.  His labs from December were reviewed.  I'll see him back in 3-4 months for follow-up.  Adrian Prows, MD, [facility] 08/09/2018, 9:01 AM [facility]. [place] Pager: [phone] Office: [phone] If no answer Cell [phone]

## 2018-08-10 ENCOUNTER — Encounter (HOSPITAL_COMMUNITY): Payer: Self-pay

## 2018-08-12 ENCOUNTER — Encounter (HOSPITAL_COMMUNITY): Payer: Self-pay

## 2018-08-13 ENCOUNTER — Encounter (HOSPITAL_COMMUNITY): Payer: Self-pay

## 2018-08-17 ENCOUNTER — Encounter (HOSPITAL_COMMUNITY): Payer: Self-pay

## 2018-08-19 ENCOUNTER — Encounter (HOSPITAL_COMMUNITY): Payer: Self-pay

## 2018-08-20 ENCOUNTER — Encounter (HOSPITAL_COMMUNITY): Payer: Self-pay

## 2018-08-24 ENCOUNTER — Encounter (HOSPITAL_COMMUNITY): Payer: Self-pay

## 2018-08-26 ENCOUNTER — Encounter (HOSPITAL_COMMUNITY): Payer: Self-pay

## 2018-08-27 ENCOUNTER — Encounter (HOSPITAL_COMMUNITY): Payer: Self-pay

## 2018-08-31 ENCOUNTER — Encounter (HOSPITAL_COMMUNITY): Payer: Self-pay

## 2018-09-02 ENCOUNTER — Encounter (HOSPITAL_COMMUNITY): Payer: Self-pay

## 2018-09-03 ENCOUNTER — Encounter (HOSPITAL_COMMUNITY): Payer: Self-pay

## 2018-09-16 DIAGNOSIS — K439 Ventral hernia without obstruction or gangrene: Secondary | ICD-10-CM | POA: Diagnosis not present

## 2018-10-05 DIAGNOSIS — R739 Hyperglycemia, unspecified: Secondary | ICD-10-CM | POA: Diagnosis not present

## 2018-10-05 DIAGNOSIS — I10 Essential (primary) hypertension: Secondary | ICD-10-CM | POA: Diagnosis not present

## 2018-10-05 DIAGNOSIS — Z0000 Encounter for general adult medical examination without abnormal findings: Secondary | ICD-10-CM | POA: Diagnosis not present

## 2018-10-05 DIAGNOSIS — E785 Hyperlipidemia, unspecified: Secondary | ICD-10-CM | POA: Diagnosis not present

## 2018-10-07 DIAGNOSIS — M6208 Separation of muscle (nontraumatic), other site: Secondary | ICD-10-CM | POA: Diagnosis not present

## 2018-10-07 DIAGNOSIS — Z7902 Long term (current) use of antithrombotics/antiplatelets: Secondary | ICD-10-CM | POA: Diagnosis not present

## 2018-10-07 DIAGNOSIS — I4891 Unspecified atrial fibrillation: Secondary | ICD-10-CM | POA: Diagnosis not present

## 2018-10-12 DIAGNOSIS — E785 Hyperlipidemia, unspecified: Secondary | ICD-10-CM | POA: Diagnosis not present

## 2018-10-12 DIAGNOSIS — I480 Paroxysmal atrial fibrillation: Secondary | ICD-10-CM | POA: Diagnosis not present

## 2018-10-12 DIAGNOSIS — Z23 Encounter for immunization: Secondary | ICD-10-CM | POA: Diagnosis not present

## 2018-10-12 DIAGNOSIS — Z0000 Encounter for general adult medical examination without abnormal findings: Secondary | ICD-10-CM | POA: Diagnosis not present

## 2018-10-22 DIAGNOSIS — L570 Actinic keratosis: Secondary | ICD-10-CM | POA: Diagnosis not present

## 2018-10-22 DIAGNOSIS — L821 Other seborrheic keratosis: Secondary | ICD-10-CM | POA: Diagnosis not present

## 2018-10-22 DIAGNOSIS — D2261 Melanocytic nevi of right upper limb, including shoulder: Secondary | ICD-10-CM | POA: Diagnosis not present

## 2018-10-22 DIAGNOSIS — C44612 Basal cell carcinoma of skin of right upper limb, including shoulder: Secondary | ICD-10-CM | POA: Diagnosis not present

## 2018-10-22 DIAGNOSIS — Z85828 Personal history of other malignant neoplasm of skin: Secondary | ICD-10-CM | POA: Diagnosis not present

## 2018-11-01 ENCOUNTER — Other Ambulatory Visit: Payer: Self-pay

## 2018-11-01 ENCOUNTER — Ambulatory Visit: Payer: Medicare Other | Admitting: Neurology

## 2018-11-01 ENCOUNTER — Encounter: Payer: Self-pay | Admitting: Neurology

## 2018-11-01 VITALS — BP 128/74 | HR 59 | Temp 97.8°F | Ht 70.0 in | Wt 197.0 lb

## 2018-11-01 DIAGNOSIS — I480 Paroxysmal atrial fibrillation: Secondary | ICD-10-CM | POA: Diagnosis not present

## 2018-11-01 DIAGNOSIS — Z9989 Dependence on other enabling machines and devices: Secondary | ICD-10-CM | POA: Diagnosis not present

## 2018-11-01 DIAGNOSIS — G4733 Obstructive sleep apnea (adult) (pediatric): Secondary | ICD-10-CM | POA: Diagnosis not present

## 2018-11-01 NOTE — Patient Instructions (Signed)
Liver impairment has forced a reduction in xeralto dose.

## 2018-11-01 NOTE — Progress Notes (Signed)
SLEEP MEDICINE CLINIC   Provider:  Larey Seat, MD  Referring Provider: Jani Gravel, MD Primary Care Physician:  Jani Gravel, MD  Chief Complaint  Patient presents with  . Follow-up    pt alone, rm 11. DME [facility]. states things are well and no concerns    HPI:  Matthew Cannon is a 78 y.o. male , seen here as a referral from Dr. Einar Gip for a sleep evaluation, to rule out in the setting of atrial fibrillation. His AHI in 2017 was 37.6/h . December 2019 he had break through atrial fib. Liver impairment has forced a reduction in xeralto dose.   Rv 11-01-2018- Matthew Cannon reports not feeling fatigued or depressed, using his CPAO compliantly.  He is married, and he and his spouse have limited their social activities, shopping and not going out much. They communicate by face time.  His eldest daughter passed away in Jun 25, 2022 from Breast cancer at age 59.  Matthew Cannon has been 100% compliant CPAP user with an average user time of 7 hours and 2 minutes.  Minimum pressure is 5 and maximum pressure setting 12 cmH2O with an EPR of 3 cmH2O.  His air leak is very low, his residual events per hour or 2.1 which is an excellent resolution.  He has mostly obstructive apneas left but also 4 minutes per night and Cheyne-Stokes respirations.  Central apneas seem to be breaking through on the CPAP.  Fatigue severity scale was endorsed at 16 and his Epworth sleepiness score at 7, the geriatric depression score at 1 point out of 15. No changes necessary.      2-30-2017.The patient had undergone a 30 day cardiac monitor , which captured 4 atrial fib attacks, 3 in sleep. He started on 3 medications , which he doesn't tolerate well. He feels sick and clammy on the medications. He has chronic rhinitis and sinusitis.  Matthew Cannon has undergone 2 Mohs surgeries, one on the left neck just below the lower jaw and one on the high forehead. This will be important should CPAP treatment be indicated after our  evaluation is completed.  Matthew Cannon medical records speak of a cardiac workup following syncope spells ; there were 3 near syncopal episodes in October - November 2016 the first one occurred while he was still working in the heat outside and he may have been dehydrated or overheated, the second and third Near-syncope occurred inside without any exertion. The symptoms lasted each time between 4 and 5 minutes or even less. He had no chest pain or tightness and no shortness of breath associated Dr. Irven Shelling workup included an event monitor and echocardiogram and a cardiac stress test he continued to have intermittent episodes of dizziness.  10-29-2017, RV on CPAP.  Matthew Cannon is seen here today is a 78 year old male patient originally referred by Dr. Einar Gip.  He has for several months not needed any kind of beta-blocker to control his heart rate and had no syncopal episodes or near syncope's.  He also did not receive a pacemaker as his heart rate is now in the low normal range. The patient is a CPAP patient who is using an autotitrator after being diagnosed with obstructive sleep apnea he has been 100% compliant over the last 90 days and 30 days.  His 30-day download shows an average use at time of 7 hours and 38 minutes at night pressure window is between 5 and 10 cmH2O pressure with 3 cm expiratory relief.  His  residual AHI is 2.6 which speaks for an excellent resolution.  He does have only minor air leakage and the 95th percentile pressure is 9.6.  I would actually like to increase his maximum pressure window by 2 cm to 12 cm water.    Sleep habits are as follows:The patient usually goes to bed between 10 and 11 PM, he is currently using one small pillow but used multiple pillows as a head rest before. He prefers the supine sleep position, the bedroom is cool, quiet and dark. He has nocturia 4-6 times each night- now 3-4 times a night , his sinus blockage makes breathing difficulties and  he snores.  He drinks a lot of water. He rises at 7 AM , often already awake after 5 AM. He doesn't wake with or from headaches and he is usually well refreshed. No alarm needed. He feels not restless, only sometimes light headed and that's not the case for month now.   Sleep medical history and family sleep history: 10-29-2017,  Atrial fib, seen by Dr. Rayann Heman - has not needed a pacemaker he reports, as heart rate has not been lower than 40 bpm, yet he had several syncope episodes. Reduced Toprol dose.  In the meantime developed a masked face and hoarse voice, no tremor.   Social history: married, retired, 2 sisters, 3 adult children. Non smoker , 3 glasses of ETOH per week. His current medications include Xarelto, lisinopril he is now also on flecainide and metoprolol. The patient has a very remote smoking history of one pack per day until1983. He is a modest alcohol drinker with 3 drinks per week and does not use caffeine aged beverages.    Review of Systems: Out of a complete 14 system review, the patient complains of only the following symptoms, and all other reviewed systems are negative. How likely are you to doze in the following situations: 0 = not likely, 1 = slight chance, 2 = moderate chance, 3 = high chance  Sitting and Reading? 2 Watching Television?  2 Sitting inactive in a public place (theater or meeting)? 0 Lying down in the afternoon when circumstances permit?2 Sitting and talking to someone?0 Sitting quietly after lunch without alcohol 1 In a car, while stopped for a few minutes in traffic? 0 As a passenger in a car for an hour without a break? 0  Total =  7 points from 15 pre CPAP.  He takes an afternoon nap around 1.30 PM of one hour 3-4 times a week.   Fatigue severity score 24/ 63 points  , depression score n/a    Social History   Socioeconomic History  . Marital status: Married    Spouse name: Not on file  . Number of children: 4  . Years of education: Not on  file  . Highest education level: Not on file  Occupational History  . Not on file  Social Needs  . Financial resource strain: Not on file  . Food insecurity    Worry: Not on file    Inability: Not on file  . Transportation needs    Medical: Not on file    Non-medical: Not on file  Tobacco Use  . Smoking status: Former Smoker    Years: 23.00    Quit date: 03/06/1981    Years since quitting: 37.6  . Smokeless tobacco: Never Used  Substance and Sexual Activity  . Alcohol use: Yes    Alcohol/week: 4.0 standard drinks    Types: 1 Standard  drinks or equivalent, 3 Glasses of wine per week    Comment: 3 x week, 1-2 wine or a beer, on Friday nights a Gin Martini  . Drug use: No  . Sexual activity: Not on file  Lifestyle  . Physical activity    Days per week: Not on file    Minutes per session: Not on file  . Stress: Not on file  Relationships  . Social Herbalist on phone: Not on file    Gets together: Not on file    Attends religious service: Not on file    Active member of club or organization: Not on file    Attends meetings of clubs or organizations: Not on file    Relationship status: Not on file  . Intimate partner violence    Fear of current or ex partner: Not on file    Emotionally abused: Not on file    Physically abused: Not on file    Forced sexual activity: Not on file  Other Topics Concern  . Not on file  Social History Narrative   Lives in [place] with spouse.  Retired from Radiation protection practitioner    Family History  Problem Relation Age of Onset  . Suicidality Father   . Alcohol abuse Father   . Cancer Daughter        breast    Past Medical History:  Diagnosis Date  . Cancer ([place])    Melanoma- forehead and cheek - left side of face  . Left atrial dilatation   . Sinus bradycardia   . Syncope   . Tachycardia-bradycardia syndrome ([place]) 08/09/2018    Past Surgical History:  Procedure Laterality Date  . ADENOIDECTOMY    . COLONOSCOPY    .  COLONOSCOPY W/ POLYPECTOMY    . DISTAL BICEPS TENDON REPAIR Right 05/18/2017   Procedure: DISTAL BICEPS TENDON REPAIR;  Surgeon: Iran Planas, MD;  Location: [facility];  Service: Orthopedics;  Laterality: Right;  . FOOT SURGERY Left    had bone spur  . INGUINAL HERNIA REPAIR Bilateral 12/15/2012   Procedure: LAPAROSCOPIC BILATERAL INGUINAL HERNIA LEFT DIRECT AND INDIRECT HERNIA  AND RIGHT DIRECT HERNIA WITH MESH;  Surgeon: Gayland Curry, MD;  [facility];  Service: General;  Laterality: Bilateral;  . INSERTION OF MESH Bilateral 12/15/2012   Procedure: INSERTION OF MESH BILATERAL;  Surgeon: Gayland Curry, MD;  [facility];  Service: General;  Laterality: Bilateral;  INGUINAL  . melanoma removal  2016   forehead, left face cheek  . TONSILLECTOMY    . VASECTOMY      Current Outpatient Medications  Medication Sig Dispense Refill  . metoprolol tartrate (LOPRESSOR) 25 MG tablet Take 12.5 mg by mouth 2 (two) times daily as needed (afib).     . Rivaroxaban (XARELTO) 15 MG TABS tablet Take 1 tablet (15 mg total) by mouth daily with supper. 90 tablet 3   No current facility-administered medications for this visit.     Allergies as of 11/01/2018 - Review Complete 11/01/2018  Allergen Reaction Noted  . Zithromax [azithromycin] Diarrhea 06/11/2012    Vitals: BP 128/74   Pulse (!) 59   Temp 97.8 F (36.6 C)   Ht 5\' 10"  (1.778 m)   Wt 197 lb (89.4 kg)   BMI 28.27 kg/m  Last Weight:  Wt Readings from Last 1 Encounters:  11/01/18 197 lb (89.4 kg)   [id number] mass index is 28.27 kg/m.     Last Height:  Ht Readings from Last 1 Encounters:  11/01/18 5\' 10"  (1.778 m)    Physical exam:  General: The patient is awake, alert and appears not in acute distress. The patient is well groomed. Head: Normocephalic, atraumatic. Neck is supple. Mallampati 2, deviated to the left. ,  neck circumference: 16.5 . Nasal airflow restricted ,Retrognathia is not seen.  Cardiovascular:  Regular rate  and rhythm, without  murmurs or carotid bruit, and without distended neck veins. Respiratory: Lungs are clear to auscultation. Skin:  Without evidence of edema, or rash Trunk: BMI is normal . The patient's posture is erect  Neurologic exam : The patient is awake and alert, oriented to place and time.   Memory subjective described as intact.  Attention span & concentration ability appears normal.  Speech is fluent,  with dysphonia , not aphasia.  Mood and affect are appropriate.  Cranial nerves:  Reduced ability to smell- for years, no change in taste. Pupils are equal and briskly reactive to light. Facial motor strength is symmetric - and he has a reduced facial expression/ mimic. His tongue and uvula move midline. Shoulder shrug is symmetrical.   Motor exam:  Normal tone, muscle bulk and symmetric strength in all extremities. Sensory:  Fine touch, pinprick and vibration were intact - Proprioception tested in the upper extremities was normal. Coordination:  Finger-to-nose maneuver  normal without evidence of ataxia, dysmetria or tremor. Gait and station: Patient walks without assistive device. Strength within normal limits.  Stance is stable and normal.   Deep tendon reflexes: in the  upper and lower extremities are symmetric and intact.    The patient was advised of the nature of the diagnosed sleep disorder , the treatment options and risks for general a health and wellness arising from not treating the condition.  I spent more than 15 minutes of face to face time with the patient. Greater than 50% of time was spent in counseling and coordination of care. We have discussed the diagnosis and differential and I answered the patient's questions.     Assessment:  After physical and neurologic examination, review of laboratory studies,  Personal review of imaging studies, reports of other /same  Imaging studies ,  Results of polysomnography/ neurophysiology testing and pre-existing records  as far as provided in visit., my assessment is   1) Mr. Galle has OSA and is well treated on CPAP auto settings and large FFM, but could benefit from an increase in pressure to 12 cm maximum.   2)He still has often a drippy nose and a chronic congested nasal passage. It's not bothering him as much.   3) atrial fibrillation and frequent nocturia have [ersited, but he is less bradycardic.   4) no REM BD activity reported. .    RV with NP in 12 month, CPAP compliance and a d finger to nose test will be needed, consider orthostatic BP and heart rate.    Asencion Partridge Linzi Ohlinger MD  11/01/2018  cc; Dr . Einar Gip,    CC: Jani Gravel, [address] [place],  [place] [address]

## 2018-11-05 DIAGNOSIS — Z0120 Encounter for dental examination and cleaning without abnormal findings: Secondary | ICD-10-CM | POA: Diagnosis not present

## 2018-12-16 ENCOUNTER — Ambulatory Visit (INDEPENDENT_AMBULATORY_CARE_PROVIDER_SITE_OTHER): Payer: Medicare Other | Admitting: Cardiology

## 2018-12-16 ENCOUNTER — Other Ambulatory Visit: Payer: Self-pay

## 2018-12-16 ENCOUNTER — Encounter: Payer: Self-pay | Admitting: Cardiology

## 2018-12-16 VITALS — BP 140/77 | HR 61 | Ht 70.0 in | Wt 196.4 lb

## 2018-12-16 DIAGNOSIS — N1831 Chronic kidney disease, stage 3a: Secondary | ICD-10-CM | POA: Diagnosis not present

## 2018-12-16 DIAGNOSIS — R0989 Other specified symptoms and signs involving the circulatory and respiratory systems: Secondary | ICD-10-CM | POA: Diagnosis not present

## 2018-12-16 DIAGNOSIS — I480 Paroxysmal atrial fibrillation: Secondary | ICD-10-CM | POA: Diagnosis not present

## 2018-12-16 NOTE — Progress Notes (Signed)
Primary Physician/Referring:  Jani Gravel, MD  Patient ID: Matthew Cannon, male    DOB: 1940-05-22, 78 y.o.   MRN: [id number]  Chief Complaint  Patient presents with  . Atrial Fibrillation  . Follow-up    HPI: Matthew Cannon  is a 78 y.o. male  Caucasian male with paroxysmal atrial fibrillation, obstructive sleep apnea and compliant with CPAP and follows Dr. Brett Fairy, has been on Xarelto since 2016, last documented A. Fib on 01/28/2018.   He essentially remains asymptomatic. He was evaluated by EP in the past due to underlying bradycardia he was recommended pacemaker implantation, however he wanted to wait. He is taking metoprolol 12.5 mg b.i.d. Previously could not tolerate this due to marked fatigue.   Today he remains asymptomatic. No history to suggest GI bleeding.  Past Medical History:  Diagnosis Date  . Cancer ([place])    Melanoma- forehead and cheek - left side of face  . Left atrial dilatation   . Sinus bradycardia   . Syncope   . Tachycardia-bradycardia syndrome ([place]) 08/09/2018    Past Surgical History:  Procedure Laterality Date  . ADENOIDECTOMY    . COLONOSCOPY    . COLONOSCOPY W/ POLYPECTOMY    . DISTAL BICEPS TENDON REPAIR Right 05/18/2017   Procedure: DISTAL BICEPS TENDON REPAIR;  Surgeon: Iran Planas, MD;  Location: [place];  Service: Orthopedics;  Laterality: Right;  . FOOT SURGERY Left    had bone spur  . INGUINAL HERNIA REPAIR Bilateral 12/15/2012   Procedure: LAPAROSCOPIC BILATERAL INGUINAL HERNIA LEFT DIRECT AND INDIRECT HERNIA  AND RIGHT DIRECT HERNIA WITH MESH;  Surgeon: Gayland Curry, MD;  Location[facility];  Service: General;  Laterality: Bilateral;  . INSERTION OF MESH Bilateral 12/15/2012   Procedure: INSERTION OF MESH BILATERAL;  Surgeon: Gayland Curry, MD;  Location: [facility];  Service: General;  Laterality: Bilateral;  INGUINAL  . melanoma removal  2016   forehead, left face cheek  . TONSILLECTOMY    . VASECTOMY      Social History    Socioeconomic History  . Marital status: Married    Spouse name: Not on file  . Number of children: 2  . Years of education: Not on file  . Highest education level: Not on file  Occupational History  . Not on file  Social Needs  . Financial resource strain: Not on file  . Food insecurity    Worry: Not on file    Inability: Not on file  . Transportation needs    Medical: Not on file    Non-medical: Not on file  Tobacco Use  . Smoking status: Former Smoker    Years: 23.00    Quit date: 03/06/1981    Years since quitting: 37.8  . Smokeless tobacco: Never Used  Substance and Sexual Activity  . Alcohol use: Yes    Alcohol/week: 4.0 standard drinks    Types: 1 Standard drinks or equivalent, 3 Glasses of wine per week    Comment: 3 x week, 1-2 wine or a beer, on Friday nights a Gin Martini  . Drug use: No  . Sexual activity: Not on file  Lifestyle  . Physical activity    Days per week: Not on file    Minutes per session: Not on file  . Stress: Not on file  Relationships  . Social Herbalist on phone: Not on file    Gets together: Not on file    Attends religious service:  Not on file    Active member of club or organization: Not on file    Attends meetings of clubs or organizations: Not on file    Relationship status: Not on file  . Intimate partner violence    Fear of current or ex partner: Not on file    Emotionally abused: Not on file    Physically abused: Not on file    Forced sexual activity: Not on file  Other Topics Concern  . Not on file  Social History Narrative   Lives in [place] with spouse.  Retired from Radiation protection practitioner    Review of Systems  Constitution: Negative for chills, decreased appetite, malaise/fatigue and weight gain.  Cardiovascular: Negative for dyspnea on exertion, leg swelling and syncope.  Endocrine: Negative for cold intolerance.  Hematologic/Lymphatic: Does not bruise/bleed easily.  Musculoskeletal: Negative for joint swelling.   Gastrointestinal: Negative for abdominal pain, anorexia, change in bowel habit, hematochezia and melena.  Neurological: Negative for headaches and light-headedness.  Psychiatric/Behavioral: Negative for depression and substance abuse.  All other systems reviewed and are negative.   Objective   Vitals with BMI 12/16/2018 11/01/2018 08/09/2018  Height 5' 10"  5' 10"  5' 10"   Weight 196 lbs 6 oz 197 lbs 190 lbs  BMI 28.18 16.10 96.04  Systolic 540 981 191  Diastolic 77 74 73  Pulse 61 59 58    Physical Exam  Constitutional: He appears well-developed and well-nourished. No distress.  HENT:  Head: Atraumatic.  Eyes: Conjunctivae are normal.  Neck: Neck supple. No JVD present. No thyromegaly present.  Cardiovascular: Regular rhythm, normal heart sounds, intact distal pulses and normal pulses. Bradycardia present. Exam reveals no gallop.  No murmur heard. Pulses:      Carotid pulses are on the right side with bruit and on the left side with bruit. Pulmonary/Chest: Effort normal and breath sounds normal.  Abdominal: Soft. Bowel sounds are normal.  Musculoskeletal: Normal range of motion.  Neurological: He is alert.  Skin: Skin is warm and dry.  Psychiatric: He has a normal mood and affect.   Radiology: No results found.  Laboratory examination:   CMP Latest Ref Rng & Units 01/28/2018 05/13/2017 12/10/2012  Glucose 70 - 99 mg/dL 103(H) 95 100(H)  BUN 8 - 23 mg/dL 23 19 29(H)  Creatinine 0.61 - 1.24 mg/dL 1.49(H) 1.37(H) 1.33  Sodium 135 - 145 mmol/L 140 139 136  Potassium 3.5 - 5.1 mmol/L 4.3 4.4 4.7  Chloride 98 - 111 mmol/L 105 105 102  CO2 22 - 32 mmol/L 28 22 25   Calcium 8.9 - 10.3 mg/dL 9.1 9.4 10.2   CBC Latest Ref Rng & Units 01/28/2018 05/13/2017 12/10/2012  WBC 4.0 - 10.5 K/uL 9.9 8.3 7.1  Hemoglobin 13.0 - 17.0 g/dL 15.8 15.4 15.7  Hematocrit 39.0 - 52.0 % 48.2 46.3 47.2  Platelets 150 - 400 K/uL 268 237 268   Lipid Panel  No results found for: CHOL, TRIG, HDL,  CHOLHDL, VLDL, LDLCALC, LDLDIRECT HEMOGLOBIN A1C No results found for: HGBA1C, MPG TSH No results for input(s): TSH in the last 8760 hours.   Medications   Current Outpatient Medications  Medication Instructions  . metoprolol tartrate (LOPRESSOR) 12.5 mg, Oral, 2 times daily PRN  . Rivaroxaban (XARELTO) 15 mg, Oral, Daily with supper    Cardiac Studies:   Echocardiogram 03/20/2015: Left ventricle cavity is normal in size. Mild concentric hypertrophy of the left ventricle. Normal global wall motion. Doppler evidence of grade I (impaired) diastolic dysfunction. Calculated EF 55%.  Left atrial cavity is moderately dilated at 4.8 cm. Mild prolapse of the mitral valve leaflets. No significant myxomatous changes noted. Mild mitral regurgitation. Mild tricuspid regurgitation. No evidence of pulmonary hypertension. Mild pulmonic regurgitation.  Treadmill exercise stress test 03/19/2015: Indication: Syncope The patient exercised according to Bruce Protocol, Total time recorded 9:09 min achieving max heart rate of 148which was 102% of MPHR for age and 10.16 METS of work. Resting ECG showing NSR. Normal BP response. There was no ST-T changes of ischemia with exercise stress test. Stress terminated due to [facility] met. and mild dyspnea. No arrhythmias. Excellent effort.  Carotid artery duplex 10/29/2017: Stenosis in the right external carotid artery (>50%). Stenosis in the left external carotid artery (<50%). bilateral external carotid stenosis source of bruit. Antegrade right vertebral artery flow. Antegrade left vertebral artery flow. Follow up studies if clinically indicated.  Assessment     ICD-10-CM   1. Paroxysmal atrial fibrillation (HCC)  I48.0 EKG 12-Lead   CHA2DS2-VASc Score is 3.  Yearly risk of stroke: 3.2% (A, Vasc Dz)  2. Bilateral carotid bruits  R09.89   3. Stage 3a chronic kidney disease  N18.31     EKG 12/16/2018: Marked sinus bradycardia at rate of 50 bpm, normal  axis, no evidence of ischemia, otherwise normal EKG. No significant change from  EKG 02/04/2018: Marked sinus bradycardia at rate of 45 beats.  Recommendations:   78 YO Caucasian male with paroxysmal atrial fibrillation, obstructive sleep apnea and compliant with CPAP and follows Dr. Brett Fairy, has been on Xarelto since 2016, last documented AF on 01/28/2018. He now presents here for 6 month follow-up.  Maintains sinus with underlying bradycardia.  He essentially remains asymptomatic. He was evaluated by EP in the past due to underlying bradycardia he was recommended pacemaker implantation, however he wanted to wait.   He has no dizziness, no syncope, no decreased exercise tolerance.  Do not suspect asymptomatic sinus bradycardia to be an issue, continued observation. He prefers to be on low dose BB due to palpitations and PAC. Renal function stable per patient by recent labs. No change done by me today. I will see him back in 1 year. Carotid exam is stable and he only has external carotid stenosis.   Adrian Prows, MD, [facility] 12/16/2018, 9:22 AM [facility] [place] Pager: [phone] Office: [phone] If no answer Cell [phone]

## 2019-01-04 DIAGNOSIS — G4733 Obstructive sleep apnea (adult) (pediatric): Secondary | ICD-10-CM | POA: Diagnosis not present

## 2019-02-11 ENCOUNTER — Other Ambulatory Visit: Payer: Self-pay | Admitting: Cardiology

## 2019-02-11 DIAGNOSIS — I480 Paroxysmal atrial fibrillation: Secondary | ICD-10-CM

## 2019-02-12 ENCOUNTER — Ambulatory Visit: Payer: Medicare Other | Attending: Internal Medicine

## 2019-02-12 DIAGNOSIS — Z23 Encounter for immunization: Secondary | ICD-10-CM

## 2019-02-12 NOTE — Progress Notes (Signed)
   Covid-19 Vaccination Clinic  Name:  JCEON HARGENS    MRN: [id number] DOB: Sep 23, 1940  02/12/2019  Mr. Plate was observed post Covid-19 immunization for 30 minutes based on pre-vaccination screening without incidence. He was provided with Vaccine Information Sheet and instruction to access the V-Safe system.   Mr. Cimo was instructed to call 911 with any severe reactions post vaccine: [name] Kitchen Difficulty breathing  . Swelling of your face and throat  . A fast heartbeat  . A bad rash all over your body  . Dizziness and weakness    Immunizations Administered    Name Date Dose VIS Date Route   Pfizer COVID-19 Vaccine 02/12/2019 12:43 PM 0.3 mL 01/14/2019 Intramuscular   Manufacturer: [place]   Lot: [id number]   [place]: [id number]

## 2019-02-13 ENCOUNTER — Ambulatory Visit: Payer: Medicare Other

## 2019-02-14 DIAGNOSIS — H18513 Endothelial corneal dystrophy, bilateral: Secondary | ICD-10-CM | POA: Diagnosis not present

## 2019-02-14 DIAGNOSIS — H2513 Age-related nuclear cataract, bilateral: Secondary | ICD-10-CM | POA: Diagnosis not present

## 2019-02-14 DIAGNOSIS — H35033 Hypertensive retinopathy, bilateral: Secondary | ICD-10-CM | POA: Diagnosis not present

## 2019-02-14 DIAGNOSIS — H25013 Cortical age-related cataract, bilateral: Secondary | ICD-10-CM | POA: Diagnosis not present

## 2019-03-02 ENCOUNTER — Ambulatory Visit: Payer: Medicare Other

## 2019-03-05 ENCOUNTER — Ambulatory Visit: Payer: Medicare Other | Attending: Internal Medicine

## 2019-03-05 DIAGNOSIS — Z23 Encounter for immunization: Secondary | ICD-10-CM | POA: Insufficient documentation

## 2019-03-05 NOTE — Progress Notes (Signed)
   Covid-19 Vaccination Clinic  Name:  Matthew Cannon    MRN: [id number] DOB: [birth year]  03/05/2019  [name] was observed post Covid-19 immunization for 15 minutes without incidence. He was provided with Vaccine Information Sheet and instruction to access the V-Safe system.   [name] was instructed to call 911 with any severe reactions post vaccine: [name] Kitchen Difficulty breathing  . Swelling of your face and throat  . A fast heartbeat  . A bad rash all over your body  . Dizziness and weakness    Immunizations Administered    Name Date Dose VIS Date Route   Pfizer COVID-19 Vaccine 03/05/2019  8:05 AM 0.3 mL 01/14/2019 Intramuscular   Manufacturer: [place]   Lot: [id number]   [place]: [id number]

## 2019-04-11 DIAGNOSIS — I480 Paroxysmal atrial fibrillation: Secondary | ICD-10-CM | POA: Diagnosis not present

## 2019-04-11 DIAGNOSIS — E785 Hyperlipidemia, unspecified: Secondary | ICD-10-CM | POA: Diagnosis not present

## 2019-04-11 DIAGNOSIS — Z125 Encounter for screening for malignant neoplasm of prostate: Secondary | ICD-10-CM | POA: Diagnosis not present

## 2019-04-11 DIAGNOSIS — Z0000 Encounter for general adult medical examination without abnormal findings: Secondary | ICD-10-CM | POA: Diagnosis not present

## 2019-04-21 DIAGNOSIS — M6208 Separation of muscle (nontraumatic), other site: Secondary | ICD-10-CM | POA: Diagnosis not present

## 2019-04-21 DIAGNOSIS — I10 Essential (primary) hypertension: Secondary | ICD-10-CM | POA: Diagnosis not present

## 2019-04-21 DIAGNOSIS — I480 Paroxysmal atrial fibrillation: Secondary | ICD-10-CM | POA: Diagnosis not present

## 2019-04-21 DIAGNOSIS — E785 Hyperlipidemia, unspecified: Secondary | ICD-10-CM | POA: Diagnosis not present

## 2019-08-02 DIAGNOSIS — G4733 Obstructive sleep apnea (adult) (pediatric): Secondary | ICD-10-CM | POA: Diagnosis not present

## 2019-08-09 ENCOUNTER — Other Ambulatory Visit: Payer: Self-pay | Admitting: Cardiology

## 2019-08-09 DIAGNOSIS — I480 Paroxysmal atrial fibrillation: Secondary | ICD-10-CM

## 2019-10-13 DIAGNOSIS — I10 Essential (primary) hypertension: Secondary | ICD-10-CM | POA: Diagnosis not present

## 2019-10-13 DIAGNOSIS — R739 Hyperglycemia, unspecified: Secondary | ICD-10-CM | POA: Diagnosis not present

## 2019-10-13 DIAGNOSIS — E785 Hyperlipidemia, unspecified: Secondary | ICD-10-CM | POA: Diagnosis not present

## 2019-10-20 DIAGNOSIS — I480 Paroxysmal atrial fibrillation: Secondary | ICD-10-CM | POA: Diagnosis not present

## 2019-10-20 DIAGNOSIS — M6208 Separation of muscle (nontraumatic), other site: Secondary | ICD-10-CM | POA: Diagnosis not present

## 2019-10-20 DIAGNOSIS — E785 Hyperlipidemia, unspecified: Secondary | ICD-10-CM | POA: Diagnosis not present

## 2019-10-20 DIAGNOSIS — I10 Essential (primary) hypertension: Secondary | ICD-10-CM | POA: Diagnosis not present

## 2019-10-28 DIAGNOSIS — Z85828 Personal history of other malignant neoplasm of skin: Secondary | ICD-10-CM | POA: Diagnosis not present

## 2019-10-28 DIAGNOSIS — L821 Other seborrheic keratosis: Secondary | ICD-10-CM | POA: Diagnosis not present

## 2019-10-28 DIAGNOSIS — D0471 Carcinoma in situ of skin of right lower limb, including hip: Secondary | ICD-10-CM | POA: Diagnosis not present

## 2019-10-28 DIAGNOSIS — Z23 Encounter for immunization: Secondary | ICD-10-CM | POA: Diagnosis not present

## 2019-10-28 DIAGNOSIS — L814 Other melanin hyperpigmentation: Secondary | ICD-10-CM | POA: Diagnosis not present

## 2019-10-28 DIAGNOSIS — Z85820 Personal history of malignant melanoma of skin: Secondary | ICD-10-CM | POA: Diagnosis not present

## 2019-11-02 ENCOUNTER — Ambulatory Visit: Payer: Medicare Other | Admitting: Family Medicine

## 2019-11-03 ENCOUNTER — Encounter: Payer: Self-pay | Admitting: Family Medicine

## 2019-11-03 ENCOUNTER — Other Ambulatory Visit: Payer: Self-pay

## 2019-11-03 ENCOUNTER — Ambulatory Visit (INDEPENDENT_AMBULATORY_CARE_PROVIDER_SITE_OTHER): Payer: Medicare Other | Admitting: Family Medicine

## 2019-11-03 VITALS — BP 146/67 | HR 47 | Ht 70.0 in | Wt 192.0 lb

## 2019-11-03 DIAGNOSIS — G4733 Obstructive sleep apnea (adult) (pediatric): Secondary | ICD-10-CM

## 2019-11-03 DIAGNOSIS — Z9989 Dependence on other enabling machines and devices: Secondary | ICD-10-CM

## 2019-11-03 NOTE — Progress Notes (Signed)
PATIENT: Matthew Cannon DOB: 1940/06/14  REASON FOR VISIT: follow up HISTORY FROM: patient  Chief Complaint  Patient presents with  . Follow-up    rm1  . Sleep Apnea    pt says he has no new concerns     HISTORY OF PRESENT ILLNESS: Today 11/03/19 Matthew Cannon is a 79 y.o. male here today for follow up for OSA on CPAP.  He is doing very well on CPAP therapy.  He does admit that it is not comfortable, however, he does note improvement in sleep quality when he is using CPAP therapy.  He denies any concerns with CPAP machine or supplies.  Compliance report dated 10/03/2019 through 11/01/2019 reveals that he used CPAP 30 of the past 30 days for compliance of 100%.  He used CPAP greater than 4 hours all 30 days.  Average usage was 7 hours and 21 minutes.  Residual AHI was 2.5 on 5 to 12 cm of water and an EPR of 3.  There was no significant leak noted.  HISTORY: (copied from Dr Dohmeier's note on 11/01/2018)   HPI:  Matthew Cannon is a 79 y.o. male , seen here as a referral from Dr. Einar Gip for a sleep evaluation, to rule out in the setting of atrial fibrillation. His AHI in 2017 was 37.6/h . December 2019 he had break through atrial fib. Liver impairment has forced a reduction in xeralto dose.   Rv 11-01-2018- Matthew Cannon reports not feeling fatigued or depressed, using his CPAO compliantly.  He is married, and he and his spouse have limited their social activities, shopping and not going out much. They communicate by face time.  His eldest daughter passed away in Jun 04, 2022 from Breast cancer at age 16.  Matthew Cannon has been 100% compliant CPAP user with an average user time of 7 hours and 2 minutes.  Minimum pressure is 5 and maximum pressure setting 12 cmH2O with an EPR of 3 cmH2O.  His air leak is very low, his residual events per hour or 2.1 which is an excellent resolution.  He has mostly obstructive apneas left but also 4 minutes per night and Cheyne-Stokes  respirations.  Central apneas seem to be breaking through on the CPAP.  Fatigue severity scale was endorsed at 16 and his Epworth sleepiness score at 7, the geriatric depression score at 1 point out of 15. No changes necessary.      2-30-2017.The patient had undergone a 30 day cardiac monitor , which captured 4 atrial fib attacks, 3 in sleep. He started on 3 medications , which he doesn't tolerate well. He feels sick and clammy on the medications. He has chronic rhinitis and sinusitis.  Matthew Cannon has undergone 2 Mohs surgeries, one on the left neck just below the lower jaw and one on the high forehead. This will be important should CPAP treatment be indicated after our evaluation is completed.  Mr. Hase medical records speak of a cardiac workup following syncope spells ; there were 3 near syncopal episodes in October - November 2016 the first one occurred while he was still working in the heat outside and he may have been dehydrated or overheated, the second and third Near-syncope occurred inside without any exertion. The symptoms lasted each time between 4 and 5 minutes or even less. He had no chest pain or tightness and no shortness of breath associated Dr. Irven Shelling workup included an event monitor and echocardiogram and a cardiac stress test he continued to  have intermittent episodes of dizziness.  10-29-2017, RV on CPAP.  Matthew Cannon is seen here today is a 79 year old male patient originally referred by Dr. Einar Gip.  He has for several months not needed any kind of beta-blocker to control his heart rate and had no syncopal episodes or near syncope's.  He also did not receive a pacemaker as his heart rate is now in the low normal range. The patient is a CPAP patient who is using an autotitrator after being diagnosed with obstructive sleep apnea he has been 100% compliant over the last 90 days and 30 days.  His 30-day download shows an average use at time of 7 hours and 38  minutes at night pressure window is between 5 and 10 cmH2O pressure with 3 cm expiratory relief.  His residual AHI is 2.6 which speaks for an excellent resolution.  He does have only minor air leakage and the 95th percentile pressure is 9.6.  I would actually like to increase his maximum pressure window by 2 cm to 12 cm water.  Sleep habits are as follows:The patient usually goes to bed between 10 and 11 PM, he is currently using one small pillow but used multiple pillows as a head rest before. He prefers the supine sleep position, the bedroom is cool, quiet and dark. He has nocturia 4-6 times each night- now 3-4 times a night , his sinus blockage makes breathing difficulties and he snores.  He drinks a lot of water. He rises at 7 AM , often already awake after 5 AM. He doesn't wake with or from headaches and he is usually well refreshed. No alarm needed. He feels not restless, only sometimes light headed and that's not the case for month now.   Sleep medical history and family sleep history: 10-29-2017,  Atrial fib, seen by Dr. Rayann Heman - has not needed a pacemaker he reports, as heart rate has not been lower than 40 bpm, yet he had several syncope episodes. Reduced Toprol dose.  In the meantime developed a masked face and hoarse voice, no tremor.   Social history: married, retired, 2 sisters, 3 adult children. Non smoker , 3 glasses of ETOH per week. His current medications include Xarelto, lisinopril he is now also on flecainide and metoprolol. The patient has a very remote smoking history of one pack per day until1983. He is a modest alcohol drinker with 3 drinks per week and does not use caffeine aged beverages.     REVIEW OF SYSTEMS: Out of a complete 14 system review of symptoms, the patient complains only of the following symptoms, none and all other reviewed systems are negative.  ESS: 8 FSS: 21  ALLERGIES: Allergies  Allergen Reactions  . Zithromax [Azithromycin] Diarrhea    HOME  MEDICATIONS: Outpatient Medications Prior to Visit  Medication Sig Dispense Refill  . metoprolol tartrate (LOPRESSOR) 25 MG tablet Take 12.5 mg by mouth 2 (two) times daily as needed (afib).     Alveda Reasons 15 MG TABS tablet TAKE 1 TABLET(15 MG) BY MOUTH DAILY WITH SUPPER 90 tablet 3   No facility-administered medications prior to visit.    PAST MEDICAL HISTORY: Past Medical History:  Diagnosis Date  . Cancer ([place])    Melanoma- forehead and cheek - left side of face  . Left atrial dilatation   . Sinus bradycardia   . Syncope   . Tachycardia-bradycardia syndrome ([place]) 08/09/2018    PAST SURGICAL HISTORY: Past Surgical History:  Procedure Laterality Date  .  ADENOIDECTOMY    . COLONOSCOPY    . COLONOSCOPY W/ POLYPECTOMY    . DISTAL BICEPS TENDON REPAIR Right 05/18/2017   Procedure: DISTAL BICEPS TENDON REPAIR;  Surgeon: Iran Planas, MD;  Location: [place];  Service: Orthopedics;  Laterality: Right;  . FOOT SURGERY Left    had bone spur  . INGUINAL HERNIA REPAIR Bilateral 12/15/2012   Procedure: LAPAROSCOPIC BILATERAL INGUINAL HERNIA LEFT DIRECT AND INDIRECT HERNIA  AND RIGHT DIRECT HERNIA WITH MESH;  Surgeon: Gayland Curry, MD;  [facility];  Service: General;  Laterality: Bilateral;  . INSERTION OF MESH Bilateral 12/15/2012   Procedure: INSERTION OF MESH BILATERAL;  Surgeon: Gayland Curry, MD;  Location[facility];  Service: General;  Laterality: Bilateral;  INGUINAL  . melanoma removal  2016   forehead, left face cheek  . TONSILLECTOMY    . VASECTOMY      FAMILY HISTORY: Family History  Problem Relation Age of Onset  . Suicidality Father   . Alcohol abuse Father   . Cancer Daughter        breast    SOCIAL HISTORY: Social History   Socioeconomic History  . Marital status: Married    Spouse name: Not on file  . Number of children: 2  . Years of education: Not on file  . Highest education level: Not on file  Occupational History  . Not on file  Tobacco Use  .  Smoking status: Former Smoker    Years: 23.00    Quit date: 03/06/1981    Years since quitting: 38.6  . Smokeless tobacco: Never Used  Vaping Use  . Vaping Use: Never used  Substance and Sexual Activity  . Alcohol use: Yes    Alcohol/week: 4.0 standard drinks    Types: 1 Standard drinks or equivalent, 3 Glasses of wine per week    Comment: 3 x week, 1-2 wine or a beer, on Friday nights a Gin Martini  . Drug use: No  . Sexual activity: Not on file  Other Topics Concern  . Not on file  Social History Narrative   Lives in [place] with spouse.  Retired from Restaurant manager, fast food of Radio broadcast assistant Strain:   . Difficulty of Paying Living Expenses: Not on file  Food Insecurity:   . Worried About Charity fundraiser in the Last Year: Not on file  . Ran Out of Food in the Last Year: Not on file  Transportation Needs:   . Lack of Transportation (Medical): Not on file  . Lack of Transportation (Non-Medical): Not on file  Physical Activity:   . Days of Exercise per Week: Not on file  . Minutes of Exercise per Session: Not on file  Stress:   . Feeling of Stress : Not on file  Social Connections:   . Frequency of Communication with Friends and Family: Not on file  . Frequency of Social Gatherings with Friends and Family: Not on file  . Attends Religious Services: Not on file  . Active Member of Clubs or Organizations: Not on file  . Attends Archivist Meetings: Not on file  . Marital Status: Not on file  Intimate Partner Violence:   . Fear of Current or Ex-Partner: Not on file  . Emotionally Abused: Not on file  . Physically Abused: Not on file  . Sexually Abused: Not on file      PHYSICAL EXAM  Vitals:   11/03/19 0852  BP: [name] Kitchen)  146/67  Pulse: (!) 47  Weight: 192 lb (87.1 kg)  Height: 5\' 10"  (1.778 m)   Body mass index is 27.55 kg/m.  Generalized: Well developed, in no acute distress  Cardiology: normal rate and rhythm, no murmur  noted Respiratory: clear to auscultation bilaterally  Neurological examination  Mentation: Alert oriented to time, place, history taking. Follows all commands speech and language fluent Cranial nerve II-XII: Pupils were equal round reactive to light. Extraocular movements were full, visual field were full  Motor: The motor testing reveals 5 over 5 strength of all 4 extremities. Good symmetric motor tone is noted throughout.  Gait and station: Gait is normal.    DIAGNOSTIC DATA (LABS, IMAGING, TESTING) - I reviewed patient records, labs, notes, testing and imaging myself where available.  No flowsheet data found.   Lab Results  Component Value Date   WBC 9.9 01/28/2018   HGB 15.8 01/28/2018   HCT 48.2 01/28/2018   MCV 93.1 01/28/2018   PLT 268 01/28/2018      Component Value Date/Time   NA 140 01/28/2018 1931   K 4.3 01/28/2018 1931   CL 105 01/28/2018 1931   CO2 28 01/28/2018 1931   GLUCOSE 103 (H) 01/28/2018 1931   BUN 23 01/28/2018 1931   CREATININE 1.49 (H) 01/28/2018 1931   CALCIUM 9.1 01/28/2018 1931   GFRNONAA 44 (L) 01/28/2018 1931   GFRAA 51 (L) 01/28/2018 1931   No results found for: CHOL, HDL, LDLCALC, LDLDIRECT, TRIG, CHOLHDL No results found for: HGBA1C No results found for: VITAMINB12 No results found for: TSH     ASSESSMENT AND PLAN 79 y.o. year old male  has a past medical history of Cancer ([place]), Left atrial dilatation, Sinus bradycardia, Syncope, and Tachycardia-bradycardia syndrome ([place]) (08/09/2018). here with     ICD-10-CM   1. OSA on CPAP  G47.33 For home use only DME continuous positive airway pressure (CPAP)   Z99.89     Abdulahi is doing very well on CPAP therapy.  Compliance report reveals excellent compliance.  He was encouraged to continue using CPAP nightly and for greater than 4 hours each night.  Healthy lifestyle habits encouraged.  He will follow-up closely with primary care.  He will follow-up with [place] in 1 year, sooner if needed.  We will  discuss replacement of CPAP machine at next appointment as it will be greater than 70 years old at that time.  He verbalizes understanding and agreement with this plan.   Orders Placed This Encounter  Procedures  . For home use only DME continuous positive airway pressure (CPAP)    Supplies    Order Specific Question:   Length of Need    Answer:   Lifetime    Order Specific Question:   Patient has OSA or probable OSA    Answer:   Yes    Order Specific Question:   Is the patient currently using CPAP in the home    Answer:   Yes    Order Specific Question:   Settings    Answer:   Other see comments    Order Specific Question:   CPAP supplies needed    Answer:   Mask, headgear, cushions, filters, heated tubing and water chamber     No orders of the defined types were placed in this encounter.     I spent 15 minutes with the patient. 50% of this time was spent counseling and educating patient on plan of care and medications.  Debbora Presto, FNP-C 11/03/2019, 9:21 AM [facility] [address], [place], [place] [address] [phone]

## 2019-11-03 NOTE — Progress Notes (Signed)
Order for cpap supplies sent to [facility] via community msg. Confirmation received that the order transmitted was successful.  

## 2019-11-03 NOTE — Patient Instructions (Signed)
Please continue using your CPAP regularly. While your insurance requires that you use CPAP at least 4 hours each night on 70% of the nights, I recommend, that you not skip any nights and use it throughout the night if you can. Getting used to CPAP and staying with the treatment long term does take time and patience and discipline. Untreated obstructive sleep apnea when it is moderate to severe can have an adverse impact on cardiovascular health and raise her risk for heart disease, arrhythmias, hypertension, congestive heart failure, stroke and diabetes. Untreated obstructive sleep apnea causes sleep disruption, nonrestorative sleep, and sleep deprivation. This can have an impact on your day to day functioning and cause daytime sleepiness and impairment of cognitive function, memory loss, mood disturbance, and problems focussing. Using CPAP regularly can improve these symptoms.   Follow up in 1 year   Sleep Apnea Sleep apnea affects breathing during sleep. It causes breathing to stop for a short time or to become shallow. It can also increase the risk of:  Heart attack.  Stroke.  Being very overweight (obese).  Diabetes.  Heart failure.  Irregular heartbeat. The goal of treatment is to help you breathe normally again. What are the causes? There are three kinds of sleep apnea:  Obstructive sleep apnea. This is caused by a blocked or collapsed airway.  Central sleep apnea. This happens when the brain does not send the right signals to the muscles that control breathing.  Mixed sleep apnea. This is a combination of obstructive and central sleep apnea. The most common cause of this condition is a collapsed or blocked airway. This can happen if:  Your throat muscles are too relaxed.  Your tongue and tonsils are too large.  You are overweight.  Your airway is too small. What increases the risk?  Being overweight.  Smoking.  Having a small airway.  Being older.  Being  male.  Drinking alcohol.  Taking medicines to calm yourself (sedatives or tranquilizers).  Having family members with the condition. What are the signs or symptoms?  Trouble staying asleep.  Being sleepy or tired during the day.  Getting angry a lot.  Loud snoring.  Headaches in the morning.  Not being able to focus your mind (concentrate).  Forgetting things.  Less interest in sex.  Mood swings.  Personality changes.  Feelings of sadness (depression).  Waking up a lot during the night to pee (urinate).  Dry mouth.  Sore throat. How is this diagnosed?  Your medical history.  A physical exam.  A test that is done when you are sleeping (sleep study). The test is most often done in a sleep lab but may also be done at home. How is this treated?   Sleeping on your side.  Using a medicine to get rid of mucus in your nose (decongestant).  Avoiding the use of alcohol, medicines to help you relax, or certain pain medicines (narcotics).  Losing weight, if needed.  Changing your diet.  Not smoking.  Using a machine to open your airway while you sleep, such as: ? An oral appliance. This is a mouthpiece that shifts your lower jaw forward. ? A CPAP device. This device blows air through a mask when you breathe out (exhale). ? An EPAP device. This has valves that you put in each nostril. ? A BPAP device. This device blows air through a mask when you breathe in (inhale) and breathe out.  Having surgery if other treatments do not work. It is   important to get treatment for sleep apnea. Without treatment, it can lead to:  High blood pressure.  Coronary artery disease.  In men, not being able to have an erection (impotence).  Reduced thinking ability. Follow these instructions at home: Lifestyle  Make changes that your doctor recommends.  Eat a healthy diet.  Lose weight if needed.  Avoid alcohol, medicines to help you relax, and some pain  medicines.  Do not use any products that contain nicotine or tobacco, such as cigarettes, e-cigarettes, and chewing tobacco. If you need help quitting, ask your doctor. General instructions  Take over-the-counter and prescription medicines only as told by your doctor.  If you were given a machine to use while you sleep, use it only as told by your doctor.  If you are having surgery, make sure to tell your doctor you have sleep apnea. You may need to bring your device with you.  Keep all follow-up visits as told by your doctor. This is important. Contact a doctor if:  The machine that you were given to use during sleep bothers you or does not seem to be working.  You do not get better.  You get worse. Get help right away if:  Your chest hurts.  You have trouble breathing in enough air.  You have an uncomfortable feeling in your back, arms, or stomach.  You have trouble talking.  One side of your body feels weak.  A part of your face is hanging down. These symptoms may be an emergency. Do not wait to see if the symptoms will go away. Get medical help right away. Call your local emergency services (911 in the [place] Do not drive yourself to the hospital. Summary  This condition affects breathing during sleep.  The most common cause is a collapsed or blocked airway.  The goal of treatment is to help you breathe normally while you sleep. This information is not intended to replace advice given to you by your health care provider. Make sure you discuss any questions you have with your health care provider. Document Revised: 11/06/2017 Document Reviewed: 09/15/2017 Elsevier Patient Education  2020 Elsevier Inc.  

## 2019-11-08 ENCOUNTER — Ambulatory Visit: Payer: Medicare Other | Attending: Internal Medicine

## 2019-11-08 DIAGNOSIS — Z23 Encounter for immunization: Secondary | ICD-10-CM

## 2019-11-08 NOTE — Progress Notes (Signed)
   Covid-19 Vaccination Clinic  Name:  Matthew Cannon    MRN: [id number] DOB: 1940-10-11  11/08/2019  [name] was observed post Covid-19 immunization for 15 minutes without incident. He was provided with Vaccine Information Sheet and instruction to access the V-Safe system.   [name] was instructed to call 911 with any severe reactions post vaccine: [name] Kitchen Difficulty breathing  . Swelling of face and throat  . A fast heartbeat  . A bad rash all over body  . Dizziness and weakness

## 2019-12-05 ENCOUNTER — Ambulatory Visit: Payer: Medicare Other | Admitting: Cardiology

## 2019-12-09 ENCOUNTER — Encounter: Payer: Self-pay | Admitting: Cardiology

## 2019-12-09 ENCOUNTER — Ambulatory Visit: Payer: Medicare Other | Admitting: Cardiology

## 2019-12-09 ENCOUNTER — Other Ambulatory Visit: Payer: Self-pay

## 2019-12-09 VITALS — BP 154/79 | HR 55 | Resp 16 | Ht 70.0 in | Wt 191.0 lb

## 2019-12-09 DIAGNOSIS — I495 Sick sinus syndrome: Secondary | ICD-10-CM

## 2019-12-09 DIAGNOSIS — I480 Paroxysmal atrial fibrillation: Secondary | ICD-10-CM

## 2019-12-09 DIAGNOSIS — N1831 Chronic kidney disease, stage 3a: Secondary | ICD-10-CM | POA: Diagnosis not present

## 2019-12-09 DIAGNOSIS — R0989 Other specified symptoms and signs involving the circulatory and respiratory systems: Secondary | ICD-10-CM | POA: Diagnosis not present

## 2019-12-09 NOTE — Progress Notes (Signed)
Primary Physician/Referring:  Jani Gravel, MD  Patient ID: Matthew Cannon, male    DOB: 11-Aug-1940, 79 y.o.   MRN: [id number]  Chief Complaint  Patient presents with  . Atrial Fibrillation  . Follow-up    1 year    HPI: Matthew Cannon  is a 79 y.o. male  Caucasian male with paroxysmal atrial fibrillation, obstructive sleep apnea and compliant with CPAP and follows Dr. Brett Fairy, has been on Xarelto since 2016, last documented A. Fib on 01/28/2018.   He essentially remains asymptomatic. He was evaluated by EP in the past due to underlying bradycardia he was recommended pacemaker implantation, however he wanted to wait. He is taking metoprolol 12.5 mg b.i.d.   Past Medical History:  Diagnosis Date  . Cancer ([place])    Melanoma- forehead and cheek - left side of face  . Left atrial dilatation   . Sinus bradycardia   . Syncope   . Tachycardia-bradycardia syndrome ([place]) 08/09/2018    Past Surgical History:  Procedure Laterality Date  . ADENOIDECTOMY    . COLONOSCOPY    . COLONOSCOPY W/ POLYPECTOMY    . DISTAL BICEPS TENDON REPAIR Right 05/18/2017   Procedure: DISTAL BICEPS TENDON REPAIR;  Surgeon: Iran Planas, MD;  Location: [facility];  Service: Orthopedics;  Laterality: Right;  . FOOT SURGERY Left    had bone spur  . INGUINAL HERNIA REPAIR Bilateral 12/15/2012   Procedure: LAPAROSCOPIC BILATERAL INGUINAL HERNIA LEFT DIRECT AND INDIRECT HERNIA  AND RIGHT DIRECT HERNIA WITH MESH;  Surgeon: Gayland Curry, MD;  Location: [facility];  Service: General;  Laterality: Bilateral;  . INSERTION OF MESH Bilateral 12/15/2012   Procedure: INSERTION OF MESH BILATERAL;  Surgeon: Gayland Curry, MD;  Location: [facility];  Service: General;  Laterality: Bilateral;  INGUINAL  . melanoma removal  2016   forehead, left face cheek  . TONSILLECTOMY    . VASECTOMY     Social History   Tobacco Use  . Smoking status: Former Smoker    Years: 23.00    Quit date: 03/06/1981    Years since quitting: 38.7    . Smokeless tobacco: Never Used  Substance Use Topics  . Alcohol use: Yes    Alcohol/week: 4.0 standard drinks    Types: 1 Standard drinks or equivalent, 3 Glasses of wine per week    Comment: 3 x week, 1-2 wine or a beer, on Friday nights a Gin Martini   Marital Status: Married   Review of Systems  Constitutional: Negative for chills, decreased appetite, malaise/fatigue and weight gain.  Cardiovascular: Negative for dyspnea on exertion, leg swelling and syncope.  Endocrine: Negative for cold intolerance.  Hematologic/Lymphatic: Does not bruise/bleed easily.  Musculoskeletal: Negative for joint swelling.  Gastrointestinal: Negative for abdominal pain, anorexia, change in bowel habit, hematochezia and melena.  Neurological: Negative for headaches and light-headedness.  Psychiatric/Behavioral: Negative for depression and substance abuse.  All other systems reviewed and are negative.  Objective   Vitals with BMI 12/09/2019 11/03/2019 12/16/2018  Height 5' 10"  5' 10"  5' 10"   Weight 191 lbs 192 lbs 196 lbs 6 oz  BMI 27.41 97.94 80.16  Systolic 553 748 270  Diastolic 79 67 77  Pulse 55 47 61    Physical Exam Constitutional:      General: He is not in acute distress.    Appearance: He is well-developed.  HENT:     Head: Atraumatic.  Eyes:     Conjunctiva/sclera: Conjunctivae normal.  Neck:  Thyroid: No thyromegaly.     Vascular: No JVD.  Cardiovascular:     Rate and Rhythm: Regular rhythm. Bradycardia present.     Pulses: Normal pulses and intact distal pulses.          Carotid pulses are on the right side with bruit and on the left side with bruit.    Heart sounds: Normal heart sounds. No murmur heard.  No gallop.   Pulmonary:     Effort: Pulmonary effort is normal.     Breath sounds: Normal breath sounds.  Abdominal:     General: Bowel sounds are normal.     Palpations: Abdomen is soft.  Musculoskeletal:        General: Normal range of motion.     Cervical back:  Neck supple.  Skin:    General: Skin is warm and dry.  Neurological:     Mental Status: He is alert.    Radiology: No results found.  Laboratory examination:   External labs:  Cholesterol, total 180.000 m 10/13/2019 HDL 53.000 mg 10/13/2019 LDL-C 116.000 m 10/13/2019 Triglycerides 58.000 mg 10/13/2019  A1C 5.700 % 10/13/2019 TSH 2.460 04/11/2019   Hemoglobin 15.800 g/d 19/10/2019  Creatinine, Serum 1.120 MG/ 10/13/2019 Potassium 4.300 mm 01/28/2018 ALT (SGPT) 18.000 IU/ 10/13/2019  Medications   Current Outpatient Medications  Medication Instructions  . metoprolol tartrate (LOPRESSOR) 12.5 mg, 2 times daily PRN  . XARELTO 15 MG TABS tablet TAKE 1 TABLET(15 MG) BY MOUTH DAILY WITH SUPPER   Cardiac Studies:   Echocardiogram 03/20/2015: Left ventricle cavity is normal in size. Mild concentric hypertrophy of the left ventricle. Normal global wall motion. Doppler evidence of grade I (impaired) diastolic dysfunction. Calculated EF 55%. Left atrial cavity is moderately dilated at 4.8 cm. Mild prolapse of the mitral valve leaflets. No significant myxomatous changes noted. Mild mitral regurgitation. Mild tricuspid regurgitation. No evidence of pulmonary hypertension. Mild pulmonic regurgitation.  Treadmill exercise stress test 03/19/2015: Indication: Syncope The patient exercised according to [facility] Protocol, Total time recorded 9:09 min achieving max heart rate of 148which was 102% of MPHR for age and 10.16 METS of work. Resting ECG showing NSR. Normal BP response. There was no ST-T changes of ischemia with exercise stress test. Stress terminated due to [facility] met. and mild dyspnea. No arrhythmias. Excellent effort.  Carotid artery duplex 10/29/2017: Stenosis in the right external carotid artery (>50%). Stenosis in the left external carotid artery (<50%). bilateral external carotid stenosis source of bruit. Antegrade right vertebral artery flow. Antegrade left vertebral artery  flow. Follow up studies if clinically indicated.  EKG:   EKG 12/09/2019: Sinus bradycardia at rate of 55 bpm, with sinus arrhythmia, normal axis, incomplete right bundle branch block.  No evidence of ischemia, normal EKG.     Assessment     ICD-10-CM   1. Paroxysmal atrial fibrillation (HCC)  I48.0 EKG 12-Lead  2. Bilateral carotid bruits  R09.89   3. Stage 3a chronic kidney disease (HCC)  N18.31   4. Tachycardia-bradycardia syndrome ([place])  I49.5     EKG 12/16/2018: Marked sinus bradycardia at rate of 50 bpm, normal axis, no evidence of ischemia, otherwise normal EKG. No significant change from  EKG 02/04/2018: Marked sinus bradycardia at rate of 45 beats.  Recommendations:   Matthew Cannon is a 79 y.o. Caucasian male with paroxysmal atrial fibrillation, obstructive sleep apnea and compliant with CPAP and follows Dr. Brett Fairy, has been on Xarelto since 2016, last documented AF on 01/28/2018. He now presents here for  12 month follow-up.  Maintains sinus with underlying bradycardia.  He essentially remains asymptomatic. He was evaluated by EP in the past due to underlying bradycardia he was recommended pacemaker implantation, however he wanted to wait.   He has no dizziness, no syncope, no decreased exercise tolerance.  Renal function stable per patient by recent labs. No change done by me today. I will see him back in 1 year. Carotid exam is stable and he only has external carotid stenosis.  He does have very loud bruit and bruit related to external carotid stenosis.  No further evaluation is indicated with this.  His lipids were evaluated, has minimal hyperlipidemia.  I do not think he has indication for statin for now.  I will see him back on annual basis.   Adrian Prows, MD, [facility] 12/09/2019, 12:06 PM Office: [phone] Pager: [phone]

## 2020-01-05 ENCOUNTER — Other Ambulatory Visit: Payer: Medicare Other

## 2020-02-16 DIAGNOSIS — H2513 Age-related nuclear cataract, bilateral: Secondary | ICD-10-CM | POA: Diagnosis not present

## 2020-02-16 DIAGNOSIS — H25013 Cortical age-related cataract, bilateral: Secondary | ICD-10-CM | POA: Diagnosis not present

## 2020-02-16 DIAGNOSIS — H43813 Vitreous degeneration, bilateral: Secondary | ICD-10-CM | POA: Diagnosis not present

## 2020-02-16 DIAGNOSIS — H35033 Hypertensive retinopathy, bilateral: Secondary | ICD-10-CM | POA: Diagnosis not present

## 2020-05-10 DIAGNOSIS — C439 Malignant melanoma of skin, unspecified: Secondary | ICD-10-CM | POA: Diagnosis not present

## 2020-05-10 DIAGNOSIS — D6869 Other thrombophilia: Secondary | ICD-10-CM | POA: Diagnosis not present

## 2020-05-10 DIAGNOSIS — I480 Paroxysmal atrial fibrillation: Secondary | ICD-10-CM | POA: Diagnosis not present

## 2020-05-10 DIAGNOSIS — R001 Bradycardia, unspecified: Secondary | ICD-10-CM | POA: Diagnosis not present

## 2020-05-17 ENCOUNTER — Telehealth: Payer: Self-pay | Admitting: Family Medicine

## 2020-05-17 DIAGNOSIS — Z9989 Dependence on other enabling machines and devices: Secondary | ICD-10-CM

## 2020-05-17 DIAGNOSIS — G4733 Obstructive sleep apnea (adult) (pediatric): Secondary | ICD-10-CM

## 2020-05-17 NOTE — Telephone Encounter (Signed)
Pt would like a call to discuss his request for a travel size CPAP, please call.

## 2020-05-17 NOTE — Telephone Encounter (Signed)
Pt called.  He has trip coming up in Fall.  Asking about travel cpap.  His old machine is 82 yrs old.  Wants replacement with travel cpap  (airmini resmed ultimate).  He asked if  insurance would cover travel cpap instead of regular cpap replacement, I was not sure,  He will call. Insurance.  He wanted [place] to write for one if so.  I also mentioned that since 5 yrs, they may want to repeat SS (that may be possiblity).  He verbalized understanding of plan. I relayed will send to AL/NP she is out of office till next Tuesday.

## 2020-05-22 NOTE — Addendum Note (Signed)
Addended by: Brandon Melnick on: 05/22/2020 11:37 AM   Modules accepted: Orders

## 2020-05-22 NOTE — Addendum Note (Signed)
Addended by: Debbora Presto L on: 05/22/2020 02:43 PM   Modules accepted: Orders

## 2020-05-22 NOTE — Telephone Encounter (Signed)
I will be happy to send orders for new CPAP if needed but remind him there is a Producer, television/film/video of machines and he may be placed on a waiting list if his machine is working, otherwise. As for the travel machines, I do not recommend regular use of these. They are smaller and more compact to make it easier to travel with but operate on battery. If insurance will cover or if he prefers to pay cash, I am happy to send orders for travel CPAP machine with same setting of current CPAP. TY.

## 2020-05-22 NOTE — Telephone Encounter (Signed)
I called pt.  I relayed that AL/NP is happy to order travel cpap for him. He realizes will have to pay OOP.  He relayed that was unsure if insurance would cover this.  He will file and see.  He stated that travel cpap are battery or plug in's.  He wanted to proceed forth with travel cpap as he is going OOT in early fall. and to order.  He will follow up in a week with [employer] to see where they were in getting one.  I donot believe this will be a replacement for his cpap that he has currently, this will be in addition.

## 2020-09-02 ENCOUNTER — Other Ambulatory Visit: Payer: Self-pay | Admitting: Cardiology

## 2020-09-02 DIAGNOSIS — I480 Paroxysmal atrial fibrillation: Secondary | ICD-10-CM

## 2020-09-10 ENCOUNTER — Telehealth: Payer: Self-pay | Admitting: Family Medicine

## 2020-09-10 NOTE — Telephone Encounter (Signed)
Pt called back and message was relayed to him.  This is FYI, no call back requested

## 2020-09-10 NOTE — Telephone Encounter (Signed)
Called the patient back, there was no answer. Left a message for a call back.   **When pt calls back please advise that the mask change should be handled by the DME company. Pt should call [employer] [phone]) to schedule a mask refit.  He doesn't need to use the CPAP on the plane. If they do have a plug in access than he can but its ok if you goes without while on plan. We do encourage taking it on as a carry on, on the plane so it doesn't get lost with baggage.   If there are any other questions, id be happy to discuss further.

## 2020-09-10 NOTE — Telephone Encounter (Signed)
Pt called, full mask causing irritation , swelling on my nose. Would like to try the nasal mask.  Also going on extended air travel, do I need to use my CPAP on the airplane or can wait until get to the hotel?[name] Kitchen Would like a call from the nurse.

## 2020-09-18 DIAGNOSIS — G4733 Obstructive sleep apnea (adult) (pediatric): Secondary | ICD-10-CM | POA: Diagnosis not present

## 2020-10-11 DIAGNOSIS — Z125 Encounter for screening for malignant neoplasm of prostate: Secondary | ICD-10-CM | POA: Diagnosis not present

## 2020-10-11 DIAGNOSIS — R739 Hyperglycemia, unspecified: Secondary | ICD-10-CM | POA: Diagnosis not present

## 2020-10-11 DIAGNOSIS — I10 Essential (primary) hypertension: Secondary | ICD-10-CM | POA: Diagnosis not present

## 2020-10-23 DIAGNOSIS — I10 Essential (primary) hypertension: Secondary | ICD-10-CM | POA: Diagnosis not present

## 2020-10-23 DIAGNOSIS — I480 Paroxysmal atrial fibrillation: Secondary | ICD-10-CM | POA: Diagnosis not present

## 2020-10-23 DIAGNOSIS — Z23 Encounter for immunization: Secondary | ICD-10-CM | POA: Diagnosis not present

## 2020-10-23 DIAGNOSIS — Z0000 Encounter for general adult medical examination without abnormal findings: Secondary | ICD-10-CM | POA: Diagnosis not present

## 2020-10-23 DIAGNOSIS — R82998 Other abnormal findings in urine: Secondary | ICD-10-CM | POA: Diagnosis not present

## 2020-10-23 DIAGNOSIS — D6869 Other thrombophilia: Secondary | ICD-10-CM | POA: Diagnosis not present

## 2020-11-01 NOTE — Progress Notes (Signed)
PATIENT: Matthew Cannon DOB: 1940-04-02  REASON FOR VISIT: follow up HISTORY FROM: patient  Chief Complaint  Patient presents with   Follow-up    Pt alone, new rm. Pt states no issues or concerns. DME [facility]       HISTORY OF PRESENT ILLNESS: 11/05/20 ALL: Matthew Cannon returns for follow up for OSA on CPAP. He continues to do well. Orders for travel machine placed in 2020-05-22 but he did not pursue. He was able to use his home CPAP with travel. He continues to do very well on CPAP. He is sleeping "great". He denies concerns with machine or supplies. His machine was set up 06/2015.     11/03/2019 ALL:  Matthew Cannon is a 80 y.o. male here today for follow up for OSA on CPAP.  He is doing very well on CPAP therapy.  He does admit that it is not comfortable, however, he does note improvement in sleep quality when he is using CPAP therapy.  He denies any concerns with CPAP machine or supplies.  Compliance report dated 10/03/2019 through 11/01/2019 reveals that he used CPAP 30 of the past 30 days for compliance of 100%.  He used CPAP greater than 4 hours all 30 days.  Average usage was 7 hours and 21 minutes.  Residual AHI was 2.5 on 5 to 12 cm of water and an EPR of 3.  There was no significant leak noted.  HISTORY: (copied from Dr Dohmeier's note on 11/01/2018)   HPI:  Matthew Cannon is a 80 y.o. male , seen here as a referral from Dr. Einar Gip for a sleep evaluation, to rule out in the setting of atrial fibrillation. His AHI in 2017 was 37.6/h . December 2019 he had break through atrial fib. Liver impairment has forced a reduction in xeralto dose.    Rv 11-01-2018- Matthew Cannon reports not feeling fatigued or depressed, using his CPAO compliantly.  He is married, and he and his spouse have limited their social activities, shopping and not going out much. They communicate by face time.  His eldest daughter passed away in 23-May-2022 from Breast cancer at age 63.   Matthew Cannon has been 100% compliant CPAP user with an average user time of 7 hours and 2 minutes.  Minimum pressure is 5 and maximum pressure setting 12 cmH2O with an EPR of 3 cmH2O.  His air leak is very low, his residual events per hour or 2.1 which is an excellent resolution.  He has mostly obstructive apneas left but also 4 minutes per night and Cheyne-Stokes respirations.  Central apneas seem to be breaking through on the CPAP.  Fatigue severity scale was endorsed at 16 and his Epworth sleepiness score at 7, the geriatric depression score at 1 point out of 15. No changes necessary.    2-30-2017.The patient had undergone a 30 day cardiac monitor , which captured 4 atrial fib attacks, 3 in sleep. He started on 3 medications , which he doesn't tolerate well. He feels sick and clammy on the medications. He has chronic rhinitis and sinusitis.  Matthew Cannon has undergone 2 Mohs surgeries, one on the left neck just below the lower jaw and one on the high forehead. This will be important should CPAP treatment be indicated after our evaluation is completed.   Matthew Cannon medical records speak of a cardiac workup following syncope spells ; there were 3 near syncopal episodes in October - November 2016 the first one occurred  while he was still working in the heat outside and he may have been dehydrated or overheated, the second and third Near-syncope occurred inside without any exertion. The symptoms lasted each time between 4 and 5 minutes or even less. He had no chest pain or tightness and no shortness of breath associated Dr. Irven Shelling workup included an event monitor and echocardiogram and a cardiac stress test he continued to have intermittent episodes of dizziness.   10-29-2017, RV on CPAP.  Matthew Cannon is seen here today is a 80 year old male patient originally referred by Dr. Einar Gip.  He has for several months not needed any kind of beta-blocker to control his heart rate and had no syncopal  episodes or near syncope's.  He also did not receive a pacemaker as his heart rate is now in the low normal range. The patient is a CPAP patient who is using an autotitrator after being diagnosed with obstructive sleep apnea he has been 100% compliant over the last 90 days and 30 days.  His 30-day download shows an average use at time of 7 hours and 38 minutes at night pressure window is between 5 and 10 cmH2O pressure with 3 cm expiratory relief.  His residual AHI is 2.6 which speaks for an excellent resolution.  He does have only minor air leakage and the 95th percentile pressure is 9.6.  I would actually like to increase his maximum pressure window by 2 cm to 12 cm water.   Sleep habits are as follows:The patient usually goes to bed between 10 and 11 PM, he is currently using one small pillow but used multiple pillows as a head rest before. He prefers the supine sleep position, the bedroom is cool, quiet and dark. He has nocturia 4-6 times each night- now 3-4 times a night , his sinus blockage makes breathing difficulties and he snores.  He drinks a lot of water. He rises at 7 AM , often already awake after 5 AM. He doesn't wake with or from headaches and he is usually well refreshed. No alarm needed. He feels not restless, only sometimes light headed and that's not the case for month now.    Sleep medical history and family sleep history: 10-29-2017,  Atrial fib, seen by Dr. Rayann Heman - has not needed a pacemaker he reports, as heart rate has not been lower than 40 bpm, yet he had several syncope episodes. Reduced Toprol dose.  In the meantime developed a masked face and hoarse voice, no tremor.    Social history: married, retired, 2 sisters, 3 adult children. Non smoker , 3 glasses of ETOH per week. His current medications include Xarelto, lisinopril he is now also on flecainide and metoprolol. The patient has a very remote smoking history of one pack per day until1983. He is a modest alcohol drinker with 3  drinks per week and does not use caffeine aged beverages.     REVIEW OF SYSTEMS: Out of a complete 14 system review of symptoms, the patient complains only of the following symptoms, none and all other reviewed systems are negative.   ALLERGIES: Allergies  Allergen Reactions   Zithromax [Azithromycin] Diarrhea    HOME MEDICATIONS: Outpatient Medications Prior to Visit  Medication Sig Dispense Refill   losartan (COZAAR) 25 MG tablet Take 25 mg by mouth daily.     metoprolol tartrate (LOPRESSOR) 25 MG tablet Take 12.5 mg by mouth 2 (two) times daily as needed (afib).     XARELTO 15 MG TABS  tablet TAKE 1 TABLET(15 MG) BY MOUTH DAILY WITH SUPPER 90 tablet 3   No facility-administered medications prior to visit.    PAST MEDICAL HISTORY: Past Medical History:  Diagnosis Date   Cancer ([place])    Melanoma- forehead and cheek - left side of face   Left atrial dilatation    Sinus bradycardia    Syncope    Tachycardia-bradycardia syndrome ([place]) 08/09/2018    PAST SURGICAL HISTORY: Past Surgical History:  Procedure Laterality Date   ADENOIDECTOMY     COLONOSCOPY     COLONOSCOPY W/ POLYPECTOMY     DISTAL BICEPS TENDON REPAIR Right 05/18/2017   Procedure: DISTAL BICEPS TENDON REPAIR;  Surgeon: Iran Planas, MD;  Location: [facility];  Service: Orthopedics;  Laterality: Right;   FOOT SURGERY Left    had bone spur   INGUINAL HERNIA REPAIR Bilateral 12/15/2012   Procedure: LAPAROSCOPIC BILATERAL INGUINAL HERNIA LEFT DIRECT AND INDIRECT HERNIA  AND RIGHT DIRECT HERNIA WITH MESH;  Surgeon: Gayland Curry, MD;  Location: [facility];  Service: General;  Laterality: Bilateral;   INSERTION OF MESH Bilateral 12/15/2012   Procedure: INSERTION OF MESH BILATERAL;  Surgeon: Gayland Curry, MD;  Location: [facility];  Service: General;  Laterality: Bilateral;  INGUINAL   melanoma removal  2016   forehead, left face cheek   TONSILLECTOMY     VASECTOMY      FAMILY HISTORY: Family History  Problem Relation Age  of Onset   Suicidality Father    Alcohol abuse Father    Cancer Daughter        breast    SOCIAL HISTORY: Social History   Socioeconomic History   Marital status: Married    Spouse name: Not on file   Number of children: 3   Years of education: Not on file   Highest education level: Not on file  Occupational History   Not on file  Tobacco Use   Smoking status: Former    Years: 23.00    Types: Cigarettes    Quit date: 03/06/1981    Years since quitting: 39.6   Smokeless tobacco: Never  Vaping Use   Vaping Use: Never used  Substance and Sexual Activity   Alcohol use: Yes    Alcohol/week: 4.0 standard drinks    Types: 1 Standard drinks or equivalent, 3 Glasses of wine per week    Comment: 3 x week, 1-2 wine or a beer, on Friday nights a Gin Martini   Drug use: No   Sexual activity: Not on file  Other Topics Concern   Not on file  Social History Narrative   Lives in [place] with spouse.  Retired from Restaurant manager, fast food of Radio broadcast assistant Strain: Not on [employer] Insecurity: Not on file  Transportation Needs: Not on file  Physical Activity: Not on file  Stress: Not on file  Social Connections: Not on file  Intimate Partner Violence: Not on file      PHYSICAL EXAM  Vitals:   11/05/20 0859  BP: (!) 144/82  Pulse: 69  Weight: 184 lb (83.5 kg)  Height: 5\' 10"  (1.778 m)    Body mass index is 26.4 kg/m.  Generalized: Well developed, in no acute distress  Cardiology: normal rate and rhythm, no murmur noted Respiratory: clear to auscultation bilaterally  Neurological examination  Mentation: Alert oriented to time, place, history taking. Follows all commands speech and language fluent Cranial nerve II-XII: Pupils were equal round reactive to  light. Extraocular movements were full, visual field were full  Motor: The motor testing reveals 5 over 5 strength of all 4 extremities. Good symmetric motor tone is noted throughout.  Gait  and station: Gait is normal.    DIAGNOSTIC DATA (LABS, IMAGING, TESTING) - I reviewed patient records, labs, notes, testing and imaging myself where available.  No flowsheet data found.   Lab Results  Component Value Date   WBC 9.9 01/28/2018   HGB 15.8 01/28/2018   HCT 48.2 01/28/2018   MCV 93.1 01/28/2018   PLT 268 01/28/2018      Component Value Date/Time   NA 140 01/28/2018 1931   K 4.3 01/28/2018 1931   CL 105 01/28/2018 1931   CO2 28 01/28/2018 1931   GLUCOSE 103 (H) 01/28/2018 1931   BUN 23 01/28/2018 1931   CREATININE 1.49 (H) 01/28/2018 1931   CALCIUM 9.1 01/28/2018 1931   GFRNONAA 44 (L) 01/28/2018 1931   GFRAA 51 (L) 01/28/2018 1931   No results found for: CHOL, HDL, LDLCALC, LDLDIRECT, TRIG, CHOLHDL No results found for: HGBA1C No results found for: VITAMINB12 No results found for: TSH    ASSESSMENT AND PLAN 80 y.o. year old male  has a past medical history of Cancer ([place]), Left atrial dilatation, Sinus bradycardia, Syncope, and Tachycardia-bradycardia syndrome ([place]) (08/09/2018). here with     ICD-10-CM   1. OSA on CPAP  G47.33 For home use only DME continuous positive airway pressure (CPAP)   Z99.89 For home use only DME continuous positive airway pressure (CPAP)       Kvion is doing very well on CPAP therapy. Compliance report reveals excellent compliance. He was encouraged to continue using CPAP nightly and for greater than 4 hours each night. I will place orders for new CPAP. Healthy lifestyle habits encouraged.  He will follow-up closely with primary care.  He will follow-up with [place] 31-90 days following set up of new CPAP, annually thereafter. He verbalizes understanding and agreement with this plan.  Set up date 06/05/2015  Orders Placed This Encounter  Procedures   For home use only DME continuous positive airway pressure (CPAP)    Supplies    Order Specific Question:   Length of Need    Answer:   Lifetime    Order Specific Question:   Patient  has OSA or probable OSA    Answer:   Yes    Order Specific Question:   Is the patient currently using CPAP in the home    Answer:   Yes    Order Specific Question:   Settings    Answer:   Other see comments    Order Specific Question:   CPAP supplies needed    Answer:   Mask, headgear, cushions, filters, heated tubing and water chamber   For home use only DME continuous positive airway pressure (CPAP)    New AutoPAP with min pressure 5cmH20, max pressure 12cmH20 and EPR 2    Order Specific Question:   Length of Need    Answer:   Lifetime    Order Specific Question:   Patient has OSA or probable OSA    Answer:   Yes    Order Specific Question:   Is the patient currently using CPAP in the home    Answer:   Yes    Order Specific Question:   Settings    Answer:   Other see comments    Order Specific Question:   CPAP supplies needed  Answer:   Mask, headgear, cushions, filters, heated tubing and water chamber      No orders of the defined types were placed in this encounter.     Debbora Presto, FNP-C 11/05/2020, 9:53 AM [facility] [address] [place], [place] [address] [phone]

## 2020-11-01 NOTE — Patient Instructions (Addendum)
Please continue using your CPAP regularly. While your insurance requires that you use CPAP at least 4 hours each night on 70% of the nights, I recommend, that you not skip any nights and use it throughout the night if you can. Getting used to CPAP and staying with the treatment long term does take time and patience and discipline. Untreated obstructive sleep apnea when it is moderate to severe can have an adverse impact on cardiovascular health and raise her risk for heart disease, arrhythmias, hypertension, congestive heart failure, stroke and diabetes. Untreated obstructive sleep apnea causes sleep disruption, nonrestorative sleep, and sleep deprivation. This can have an impact on your day to day functioning and cause daytime sleepiness and impairment of cognitive function, memory loss, mood disturbance, and problems focussing. Using CPAP regularly can improve these symptoms.   We will order new CPAP. Please listen out for a call from [facility]. We will see you back 31-90 days following set up date. Please let me know if you need me.

## 2020-11-02 DIAGNOSIS — C44219 Basal cell carcinoma of skin of left ear and external auricular canal: Secondary | ICD-10-CM | POA: Diagnosis not present

## 2020-11-02 DIAGNOSIS — Z85828 Personal history of other malignant neoplasm of skin: Secondary | ICD-10-CM | POA: Diagnosis not present

## 2020-11-02 DIAGNOSIS — L603 Nail dystrophy: Secondary | ICD-10-CM | POA: Diagnosis not present

## 2020-11-02 DIAGNOSIS — L814 Other melanin hyperpigmentation: Secondary | ICD-10-CM | POA: Diagnosis not present

## 2020-11-02 DIAGNOSIS — L821 Other seborrheic keratosis: Secondary | ICD-10-CM | POA: Diagnosis not present

## 2020-11-05 ENCOUNTER — Ambulatory Visit: Payer: Medicare Other | Admitting: Family Medicine

## 2020-11-05 ENCOUNTER — Encounter: Payer: Self-pay | Admitting: Family Medicine

## 2020-11-05 VITALS — BP 144/82 | HR 69 | Ht 70.0 in | Wt 184.0 lb

## 2020-11-05 DIAGNOSIS — G4733 Obstructive sleep apnea (adult) (pediatric): Secondary | ICD-10-CM

## 2020-11-05 DIAGNOSIS — Z9989 Dependence on other enabling machines and devices: Secondary | ICD-10-CM

## 2020-11-22 DIAGNOSIS — G4733 Obstructive sleep apnea (adult) (pediatric): Secondary | ICD-10-CM | POA: Diagnosis not present

## 2020-11-23 DIAGNOSIS — G4733 Obstructive sleep apnea (adult) (pediatric): Secondary | ICD-10-CM | POA: Diagnosis not present

## 2020-11-29 DIAGNOSIS — C44219 Basal cell carcinoma of skin of left ear and external auricular canal: Secondary | ICD-10-CM | POA: Diagnosis not present

## 2020-11-29 DIAGNOSIS — Z85828 Personal history of other malignant neoplasm of skin: Secondary | ICD-10-CM | POA: Diagnosis not present

## 2020-12-10 ENCOUNTER — Ambulatory Visit: Payer: Medicare Other | Admitting: Cardiology

## 2021-01-02 ENCOUNTER — Other Ambulatory Visit: Payer: Self-pay

## 2021-01-02 ENCOUNTER — Ambulatory Visit: Payer: Medicare Other | Admitting: Cardiology

## 2021-01-02 ENCOUNTER — Encounter: Payer: Self-pay | Admitting: Cardiology

## 2021-01-02 VITALS — BP 150/88 | HR 55 | Temp 98.1°F | Resp 16 | Ht 70.0 in | Wt 184.6 lb

## 2021-01-02 DIAGNOSIS — R0989 Other specified symptoms and signs involving the circulatory and respiratory systems: Secondary | ICD-10-CM | POA: Diagnosis not present

## 2021-01-02 DIAGNOSIS — I10 Essential (primary) hypertension: Secondary | ICD-10-CM | POA: Diagnosis not present

## 2021-01-02 DIAGNOSIS — I495 Sick sinus syndrome: Secondary | ICD-10-CM

## 2021-01-02 DIAGNOSIS — I480 Paroxysmal atrial fibrillation: Secondary | ICD-10-CM

## 2021-01-02 MED ORDER — NIFEDIPINE ER OSMOTIC RELEASE 30 MG PO TB24: 30.0000 mg | ORAL_TABLET | Freq: Every day | ORAL | 2 refills | Status: DC

## 2021-01-02 NOTE — Progress Notes (Signed)
Primary Physician/Referring:  Shon Baton, MD  Patient ID: Matthew Cannon, male    DOB: August 20, 1940, 80 y.o.   MRN: [id number]  No chief complaint on file.   HPI: Matthew Cannon  is a 80 y.o. male  Caucasian male with paroxysmal atrial fibrillation, obstructive sleep apnea and compliant with CPAP and follows Dr. Brett Fairy, has been on Xarelto since 2016, last documented A. Fib on 01/28/2018.   He has noticed his blood pressure to be elevated, especially systolic blood pressure.  He tried losartan prescribed by his PCP but did not tolerate this he also states that occasionally he sees some flashing lights in his right eye.  He continues to exercise regularly, denies any fatigue, dyspnea or chest pain.  He was evaluated by EP in the past due to underlying bradycardia he was recommended pacemaker implantation, however he wanted to wait.   Past Medical History:  Diagnosis Date   Cancer ([place])    Melanoma- forehead and cheek - left side of face   Left atrial dilatation    Sinus bradycardia    Syncope    Tachycardia-bradycardia syndrome ([place]) 08/09/2018    Past Surgical History:  Procedure Laterality Date   ADENOIDECTOMY     COLONOSCOPY     COLONOSCOPY W/ POLYPECTOMY     DISTAL BICEPS TENDON REPAIR Right 05/18/2017   Procedure: DISTAL BICEPS TENDON REPAIR;  Surgeon: Iran Planas, MD;  Location: [facility];  Service: Orthopedics;  Laterality: Right;   FOOT SURGERY Left    had bone spur   INGUINAL HERNIA REPAIR Bilateral 12/15/2012   Procedure: LAPAROSCOPIC BILATERAL INGUINAL HERNIA LEFT DIRECT AND INDIRECT HERNIA  AND RIGHT DIRECT HERNIA WITH MESH;  Surgeon: Gayland Curry, MD;  [facility];  Service: General;  Laterality: Bilateral;   INSERTION OF MESH Bilateral 12/15/2012   Procedure: INSERTION OF MESH BILATERAL;  Surgeon: Gayland Curry, MD;  [facility];  Service: General;  Laterality: Bilateral;  INGUINAL   melanoma removal  2016   forehead, left face cheek    TONSILLECTOMY     VASECTOMY     Social History   Tobacco Use   Smoking status: Former    Years: 23.00    Types: Cigarettes    Quit date: 03/06/1981    Years since quitting: 39.8   Smokeless tobacco: Never  Substance Use Topics   Alcohol use: Yes    Alcohol/week: 4.0 standard drinks    Types: 1 Standard drinks or equivalent, 3 Glasses of wine per week    Comment: 3 x week, 1-2 wine or a beer, on Friday nights a Gin Martini   Marital Status: Married   Review of Systems  Cardiovascular:  Negative for chest pain, dyspnea on exertion and leg swelling.  Gastrointestinal:  Negative for melena.  Objective   Vitals with BMI 01/02/2021 01/02/2021 11/05/2020  Height - _0  _1   Weight - 184 lbs 10 oz 184 lbs  BMI - 49.44 96.7  Systolic 591 638 466  Diastolic 88 92 82  Pulse - 55 69    Physical Exam Neck:     Vascular: Carotid bruit (bilateral) present. No JVD.  Cardiovascular:     Rate and Rhythm: Regular rhythm. Bradycardia present.     Pulses: Intact distal pulses.     Heart sounds: Normal heart sounds. No murmur heard.   No gallop.  Pulmonary:     Effort: Pulmonary effort is normal.     Breath sounds: Normal breath sounds.  Abdominal:     General: Bowel sounds are normal.     Palpations: Abdomen is soft.  Musculoskeletal:        General: No swelling.   Radiology: No results found.  Laboratory examination:   External labs:  Cholesterol, total 180.000 m 10/13/2019 HDL 53.000 mg 10/13/2019 LDL-C 116.000 m 10/13/2019 Triglycerides 58.000 mg 10/13/2019  A1C 5.700 % 10/13/2019 TSH 2.460 04/11/2019   Hemoglobin 15.800 g/d 19/10/2019  Creatinine, Serum 1.120 MG/ 10/13/2019 Potassium 4.300 mm 01/28/2018 ALT (SGPT) 18.000 IU/ 10/13/2019  Medications   Current Outpatient Medications  Medication Instructions   NIFEdipine (PROCARDIA XL) 30 mg, Oral, Daily   XARELTO 15 MG TABS tablet TAKE 1 TABLET(15 MG) BY MOUTH DAILY WITH SUPPER   Cardiac Studies:   Echocardiogram  03/20/2015: Left ventricle cavity is normal in size. Mild concentric hypertrophy of the left ventricle. Normal global wall motion. Doppler evidence of grade I (impaired) diastolic dysfunction. Calculated EF 55%. Left atrial cavity is moderately dilated at 4.8 cm. Mild prolapse of the mitral valve leaflets. No significant myxomatous changes noted. Mild mitral regurgitation. Mild tricuspid regurgitation. No evidence of pulmonary hypertension. Mild pulmonic regurgitation.  Treadmill exercise stress test 03/19/2015: Indication: Syncope The patient exercised according to [facility] Protocol, Total time recorded  9:09 min achieving max heart rate of   148which was  102% of MPHR for age and  10.16 METS of work. Resting ECG showing NSR.  Normal BP response. There was no ST-T changes of ischemia with exercise stress test. Stress terminated due to  [facility] met. and mild dyspnea.  No arrhythmias. Excellent effort.  Carotid artery duplex 10/29/2017: Stenosis in the right external carotid artery (>50%). Stenosis in the left external carotid artery (<50%). bilateral external carotid stenosis source of bruit. Antegrade right vertebral artery flow. Antegrade left vertebral artery flow. Follow up studies if clinically indicated.  EKG:   EKG 01/02/2021: Marked sinus bradycardia at the rate of 51 bpm, incomplete right bundle branch block.  Otherwise normal EKG.  No change from 12/09/2019 including heart rate, previously 55 bpm.  Assessment     ICD-10-CM   1. Paroxysmal atrial fibrillation (HCC)  I48.0 EKG 12-Lead    2. Sinus node dysfunction (HCC)  I49.5     3. Primary hypertension  I10 NIFEdipine (PROCARDIA XL) 30 MG 24 hr tablet    4. Bilateral carotid bruits  R09.89 PCV CAROTID DUPLEX (BILATERAL)      EKG 12/16/2018: Marked sinus bradycardia at rate of 50 bpm, normal axis, no evidence of ischemia, otherwise normal EKG. No significant change from  EKG 02/04/2018: Marked sinus bradycardia at rate of 45  beats.  Recommendations:   Matthew Cannon is a 80 y.o. Caucasian male with paroxysmal atrial fibrillation, obstructive sleep apnea and compliant with CPAP and follows Dr. Brett Fairy, has been on Xarelto since 2016, last documented A. Fib on 01/28/2018.   He has noticed his blood pressure to be elevated, especially systolic blood pressure. He also states that occasionally he sees some flashing lights in his right eye.  He continues to exercise regularly, denies any fatigue, dyspnea or chest pain.  Like to try nifedipine XL 30 mg daily hopefully this will also help with improvement in heart rate.  Patient is very sensitive to the medication, will start low-dose, will continue to monitor his blood pressure closely.  His right carotid bruit appears to be much more prominent, last carotid duplex was in 2019.  We will recheck carotid duplex.  I will see him back  in 6 weeks for follow-up of the above and make further recommendations.  If indeed he has significant right carotid stenosis, we may have to revisit starting statin therapy as he does have very minimal elevation in LDL.   Adrian Prows, MD, [facility] 01/02/2021, 4:37 PM Office: [phone] Pager: [phone]

## 2021-01-08 ENCOUNTER — Other Ambulatory Visit: Payer: Self-pay

## 2021-01-08 ENCOUNTER — Ambulatory Visit: Payer: Medicare Other

## 2021-01-08 DIAGNOSIS — R0989 Other specified symptoms and signs involving the circulatory and respiratory systems: Secondary | ICD-10-CM

## 2021-01-22 ENCOUNTER — Telehealth: Payer: Self-pay | Admitting: Family Medicine

## 2021-01-22 NOTE — Telephone Encounter (Signed)
Pt called, started using new CPAP machine on October 20.Want to know if pressure on CPAP needs to be changed, have earache. If the pressure is not correct causing me to have an earache. Would like a call from the nurse.

## 2021-01-22 NOTE — Telephone Encounter (Addendum)
Called pt to further discuss. Reports ear ache in left ear. Started about a week ago. Advised that his DL looks good from cpap machine. He reports no issues w/ mask fit/air leaks/pressure issues. States he is sleeping really well and doing better than on old machine. Advised it is unlikely the cpap machine. Could be possible ear infection. He will f/u on PCP to be evaluated further. He will call back if anything else is needed.

## 2021-02-14 ENCOUNTER — Ambulatory Visit: Payer: Medicare Other | Admitting: Cardiology

## 2021-02-14 ENCOUNTER — Encounter: Payer: Self-pay | Admitting: Cardiology

## 2021-02-14 ENCOUNTER — Other Ambulatory Visit: Payer: Self-pay

## 2021-02-14 VITALS — BP 140/69 | HR 57 | Temp 97.5°F | Resp 16 | Ht 70.0 in | Wt 189.0 lb

## 2021-02-14 DIAGNOSIS — I10 Essential (primary) hypertension: Secondary | ICD-10-CM

## 2021-02-14 DIAGNOSIS — I480 Paroxysmal atrial fibrillation: Secondary | ICD-10-CM

## 2021-02-14 DIAGNOSIS — I495 Sick sinus syndrome: Secondary | ICD-10-CM

## 2021-02-14 DIAGNOSIS — I6523 Occlusion and stenosis of bilateral carotid arteries: Secondary | ICD-10-CM | POA: Diagnosis not present

## 2021-02-14 DIAGNOSIS — E785 Hyperlipidemia, unspecified: Secondary | ICD-10-CM

## 2021-02-14 MED ORDER — ATORVASTATIN CALCIUM 10 MG PO TABS: 10.0000 mg | ORAL_TABLET | Freq: Every day | ORAL | 2 refills | Status: DC

## 2021-02-14 MED ORDER — OLMESARTAN MEDOXOMIL-HCTZ 20-12.5 MG PO TABS: 1.0000 | ORAL_TABLET | ORAL | 2 refills | Status: DC

## 2021-02-14 NOTE — Progress Notes (Signed)
Primary Physician/Referring:  Shon Baton, MD  Patient ID: Matthew Cannon, male    DOB: [birth year], 81 y.o.   MRN: [id number]  Chief Complaint  Patient presents with   Atrial Fibrillation   Hypertension   Follow-up    6 weeks     HPI: Matthew Cannon  is a 81 y.o. male  Caucasian male with paroxysmal atrial fibrillation, obstructive sleep apnea and compliant with CPAP and follows Dr. Brett Fairy, has been on Xarelto since 2016, last documented A. Fib on 01/28/2018.  He is tolerating anticoagulation without any bleeding diathesis.  He has noticed his blood pressure to be elevated, especially systolic blood pressure in spite of being on nifedipine XL.  He continues to exercise regularly, denies any fatigue, dyspnea or chest pain.   Past Medical History:  Diagnosis Date   Cancer ([place])    Melanoma- forehead and cheek - left side of face   Left atrial dilatation    Sinus bradycardia    Syncope    Tachycardia-bradycardia syndrome ([place]) 08/09/2018    Past Surgical History:  Procedure Laterality Date   ADENOIDECTOMY     COLONOSCOPY     COLONOSCOPY W/ POLYPECTOMY     DISTAL BICEPS TENDON REPAIR Right 05/18/2017   Procedure: DISTAL BICEPS TENDON REPAIR;  Surgeon: Iran Planas, MD;  Location: [facility];  Service: Orthopedics;  Laterality: Right;   FOOT SURGERY Left    had bone spur   INGUINAL HERNIA REPAIR Bilateral 12/15/2012   Procedure: LAPAROSCOPIC BILATERAL INGUINAL HERNIA LEFT DIRECT AND INDIRECT HERNIA  AND RIGHT DIRECT HERNIA WITH MESH;  Surgeon: Gayland Curry, MD;  Location: [facility];  Service: General;  Laterality: Bilateral;   INSERTION OF MESH Bilateral 12/15/2012   Procedure: INSERTION OF MESH BILATERAL;  Surgeon: Gayland Curry, MD;  Location: [facility];  Service: General;  Laterality: Bilateral;  INGUINAL   melanoma removal  2016   forehead, left face cheek   TONSILLECTOMY     VASECTOMY     Social History   Tobacco Use   Smoking status: Former    Packs/day: 1.00     Years: 23.00    Pack years: 23.00    Types: Cigarettes    Quit date: 03/06/1981    Years since quitting: 39.9   Smokeless tobacco: Never  Substance Use Topics   Alcohol use: Yes    Alcohol/week: 4.0 standard drinks    Types: 1 Standard drinks or equivalent, 3 Glasses of wine per week    Comment: 3 x week, 1-2 wine or a beer, on Friday nights a Gin Martini   Marital Status: Married   Review of Systems  Cardiovascular:  Negative for chest pain, dyspnea on exertion and leg swelling.  Gastrointestinal:  Negative for melena.  Objective   Vitals with BMI 02/14/2021 01/02/2021 01/02/2021  Height _0  - _1   Weight 189 lbs - 184 lbs 10 oz  BMI 73.53 - 29.92  Systolic 426 834 196  Diastolic 69 88 92  Pulse 57 - 55    Physical Exam Neck:     Vascular: Carotid bruit (bilateral R>L) present. No JVD.  Cardiovascular:     Rate and Rhythm: Regular rhythm. Bradycardia present.     Pulses: Intact distal pulses.     Heart sounds: Normal heart sounds. No murmur heard.   No gallop.  Pulmonary:     Effort: Pulmonary effort is normal.     Breath sounds: Normal breath sounds.  Abdominal:  General: Bowel sounds are normal.     Palpations: Abdomen is soft.  Musculoskeletal:        General: No swelling.   Radiology: No results found.  Laboratory examination:   External labs:  Cholesterol, total 180.000 m 10/13/2019 HDL 53.000 mg 10/13/2019 LDL-C 116.000 m 10/13/2019 Triglycerides 58.000 mg 10/13/2019  A1C 5.700 % 10/13/2019 TSH 2.460 04/11/2019   Hemoglobin 15.800 g/d 19/10/2019  Creatinine, Serum 1.120 MG/ 10/13/2019 Potassium 4.300 mm 01/28/2018 ALT (SGPT) 18.000 IU/ 10/13/2019  Medications   Current Outpatient Medications  Medication Instructions   atorvastatin (LIPITOR) 10 mg, Oral, Daily   NIFEdipine (PROCARDIA XL) 30 mg, Oral, Daily   olmesartan-hydrochlorothiazide (BENICAR HCT) 20-12.5 MG tablet 1 tablet, Oral, BH-each morning   XARELTO 15 MG TABS tablet TAKE 1 TABLET(15 MG)  BY MOUTH DAILY WITH SUPPER   Cardiac Studies:   Echocardiogram 03/20/2015: Left ventricle cavity is normal in size. Mild concentric hypertrophy of the left ventricle. Normal global wall motion. Doppler evidence of grade I (impaired) diastolic dysfunction. Calculated EF 55%. Left atrial cavity is moderately dilated at 4.8 cm. Mild prolapse of the mitral valve leaflets. No significant myxomatous changes noted. Mild mitral regurgitation. Mild tricuspid regurgitation. No evidence of pulmonary hypertension. Mild pulmonic regurgitation.  Treadmill exercise stress test 03/19/2015: Indication: Syncope The patient exercised according to Bruce Protocol, Total time recorded  9:09 min achieving max heart rate of   148which was  102% of MPHR for age and  10.16 METS of work. Resting ECG showing NSR.  Normal BP response. There was no ST-T changes of ischemia with exercise stress test. Stress terminated due to  [facility] met. and mild dyspnea.  No arrhythmias. Excellent effort.  Carotid artery duplex 01/08/2021: Duplex suggests stenosis in the right internal carotid artery (minimal). Duplex suggests stenosis in the right external carotid artery (>50%). Duplex suggests stenosis in the left internal carotid artery (minimal). Duplex suggests stenosis in the left external carotid artery (<50%). There is mild heterogenous plaque noted in bilateral carotid arteries. Antegrade right vertebral artery flow. Antegrade left vertebral artery flow. No significant change from 10/29/2017. Follow up is appropriate if clinically indicated.  EKG:   EKG 02/14/2021: Normal sinus rhythm at the rate of 58 bpm, normal axis, incomplete right bundle branch block.  Single PAC.  No change from EKG 01/02/2021   Assessment     ICD-10-CM   1. Paroxysmal atrial fibrillation (HCC)  I48.0 EKG 12-Lead    2. Sinus node dysfunction (HCC)  I49.5     3. Primary hypertension  I10 olmesartan-hydrochlorothiazide (BENICAR HCT) 20-12.5 MG tablet     CMP14+EGFR    4. Atherosclerosis of both carotid arteries  I65.23 atorvastatin (LIPITOR) 10 MG tablet    CANCELED: PCV CAROTID DUPLEX (BILATERAL)    5. Mild hyperlipidemia  E78.5 atorvastatin (LIPITOR) 10 MG tablet    Lipid Panel With LDL/HDL Ratio      CHA2DS2-VASc Score is 4.  Yearly risk of stroke: 4.8% (A, HTN, carotid atherosclerosis).  Score of 1=0.6; 2=2.2; 3=3.2; 4=4.8; 5=7.2; 6=9.8; 7=>9.8) -(CHF; HTN; vasc disease DM,  Male = 1; Age <65 =0; 65-74 = 1,  >75 =2; stroke/embolism= 2).    Recommendations:   ERUBIEL MANASCO is a 81 y.o. Caucasian male with paroxysmal atrial fibrillation, obstructive sleep apnea and compliant with CPAP and follows Dr. Brett Fairy, has been on Xarelto since 2016, last documented A. Fib on 01/28/2018.  He is tolerating anticoagulation without any bleeding diathesis.  He has noticed his blood pressure to  be elevated, especially systolic blood pressure in spite of being on nifedipine XL although it has increased his heart rate from 45 beats per minute to present 56 to 60 bpm.  He is also developing mild leg edema.  He is concerned about elevated blood pressure, extensive discussion regarding management of hypertension, he is very sensitive and careful with taking medications.  Discontinued nifedipine XL and switched him to Benicar HCT 20/12.5 mg in the morning, will obtain BMP in 3 to 4 weeks.  I also had extensive discussion with the patient regarding hyperlipidemia although mild, he clearly has carotid plaque and very prominent carotid bruit.  In view of carotid atherosclerosis, after long discussions, he is willing to start atorvastatin 10 mg daily.  We will obtain lipid profile testing again in 4 weeks for follow-up.  I would like to see him back in 6 weeks for follow-up.  This is a extensive discussion regarding management of hypertension, hyperlipidemia, atrial fibrillation and CV risk reduction, 45 minutes face-to-face encounter.      Adrian Prows, MD,  [facility] 02/14/2021, 1:08 PM Office: [phone] Pager: [phone]

## 2021-02-18 DIAGNOSIS — H02831 Dermatochalasis of right upper eyelid: Secondary | ICD-10-CM | POA: Diagnosis not present

## 2021-02-18 DIAGNOSIS — H25813 Combined forms of age-related cataract, bilateral: Secondary | ICD-10-CM | POA: Diagnosis not present

## 2021-02-18 DIAGNOSIS — G43B0 Ophthalmoplegic migraine, not intractable: Secondary | ICD-10-CM | POA: Diagnosis not present

## 2021-02-18 DIAGNOSIS — H43813 Vitreous degeneration, bilateral: Secondary | ICD-10-CM | POA: Diagnosis not present

## 2021-03-08 DIAGNOSIS — I10 Essential (primary) hypertension: Secondary | ICD-10-CM | POA: Diagnosis not present

## 2021-03-08 DIAGNOSIS — E785 Hyperlipidemia, unspecified: Secondary | ICD-10-CM | POA: Diagnosis not present

## 2021-03-09 LAB — CMP14+EGFR
ALT: 19 IU/L (ref 0–44)
AST: 23 IU/L (ref 0–40)
Albumin/Globulin Ratio: 1.7 (ref 1.2–2.2)
Albumin: 4.7 g/dL — ABNORMAL HIGH (ref 3.6–4.6)
Alkaline Phosphatase: 76 IU/L (ref 44–121)
BUN/Creatinine Ratio: 20 (ref 10–24)
BUN: 24 mg/dL (ref 8–27)
Bilirubin Total: 0.7 mg/dL (ref 0.0–1.2)
CO2: 24 mmol/L (ref 20–29)
Calcium: 9.1 mg/dL (ref 8.6–10.2)
Chloride: 102 mmol/L (ref 96–106)
Creatinine, Ser: 1.23 mg/dL (ref 0.76–1.27)
Globulin, Total: 2.7 g/dL (ref 1.5–4.5)
Glucose: 105 mg/dL — ABNORMAL HIGH (ref 70–99)
Potassium: 4.6 mmol/L (ref 3.5–5.2)
Sodium: 140 mmol/L (ref 134–144)
Total Protein: 7.4 g/dL (ref 6.0–8.5)
eGFR: 59 mL/min/{1.73_m2} — ABNORMAL LOW (ref >59)

## 2021-03-09 LAB — LIPID PANEL WITH LDL/HDL RATIO
Cholesterol, Total: 146 mg/dL (ref 100–199)
HDL: 52 mg/dL (ref >39)
LDL Chol Calc (NIH): 76 mg/dL (ref 0–99)
LDL/HDL Ratio: 1.5 ratio (ref 0.0–3.6)
Triglycerides: 99 mg/dL (ref 0–149)
VLDL Cholesterol Cal: 18 mg/dL (ref 5–40)

## 2021-03-11 ENCOUNTER — Encounter: Payer: Self-pay | Admitting: Neurology

## 2021-03-11 NOTE — Patient Instructions (Signed)

## 2021-03-11 NOTE — Progress Notes (Signed)
° °  PATIENT: Matthew Cannon DOB: 11-08-1940  REASON FOR VISIT: follow up HISTORY FROM: patient  Virtual Visit via Telephone Note  I connected with Matthew Cannon on 03/12/21 at 11:30 AM EST by telephone and verified that I am speaking with the correct person using two identifiers.   I discussed the limitations, risks, security and privacy concerns of performing an evaluation and management service by telephone and the availability of in person appointments. I also discussed with the patient that there may be a patient responsible charge related to this service. The patient expressed understanding and agreed to proceed.   History of Present Illness:  03/12/21 ALL: Matthew Cannon is a 81 y.o. male here today for follow up for OSA on CPAP. He recently received a new CPAP machine and returns for initial compliance report. He is doing very well. He is using his new machine nightly for at least 4 hours. He feels he may be sleeping better than with his old machine.      11/05/20 ALL: Matthew Cannon returns for follow up for OSA on CPAP. He continues to do well. Orders for travel machine placed in April 2022 but he did not pursue. He was able to use his home CPAP with travel. He continues to do very well on CPAP. He is sleeping "great". He denies concerns with machine or supplies. His machine was set up 06/2015.        Observations/Objective:  Generalized: Well developed, in no acute distress  Mentation: Alert oriented to time, place, history taking. Follows all commands speech and language fluent   Assessment and Plan:  81 y.o. year old male  has a past medical history of Cancer ([name]), Left atrial dilatation, Sinus bradycardia, Syncope, and Tachycardia-bradycardia syndrome ([place]) (08/09/2018). here with    ICD-10-CM   1. OSA on CPAP  G47.33    Z99.89      Matthew Cannon is doing great on his new CPAP machine. Compliance reports reveals excellent compliance. AHI is well managed. He will  continue CPAP therapy nightly for at least 4 hours. He will continue healthy lifestyle habits and follow up with care team as directed. Follow up with me in 1 year.    No orders of the defined types were placed in this encounter.   No orders of the defined types were placed in this encounter.    Follow Up Instructions:  I discussed the assessment and treatment plan with the patient. The patient was provided an opportunity to ask questions and all were answered. The patient agreed with the plan and demonstrated an understanding of the instructions.   The patient was advised to call back or seek an in-person evaluation if the symptoms worsen or if the condition fails to improve as anticipated.  I provided 15 minutes of non-face-to-face time during this encounter. Patient located at their place of residence during [place] visit. Provider is in the office.    Debbora Presto, NP

## 2021-03-12 ENCOUNTER — Encounter: Payer: Self-pay | Admitting: Family Medicine

## 2021-03-12 ENCOUNTER — Telehealth: Payer: Medicare Other | Admitting: Family Medicine

## 2021-03-12 DIAGNOSIS — Z9989 Dependence on other enabling machines and devices: Secondary | ICD-10-CM

## 2021-03-12 DIAGNOSIS — G4733 Obstructive sleep apnea (adult) (pediatric): Secondary | ICD-10-CM

## 2021-03-15 ENCOUNTER — Other Ambulatory Visit: Payer: Self-pay

## 2021-03-15 ENCOUNTER — Ambulatory Visit: Payer: Medicare Other | Admitting: Cardiology

## 2021-03-15 ENCOUNTER — Encounter: Payer: Self-pay | Admitting: Cardiology

## 2021-03-15 VITALS — BP 130/72 | HR 59 | Temp 98.7°F | Resp 16 | Ht 70.0 in | Wt 187.0 lb

## 2021-03-15 DIAGNOSIS — I10 Essential (primary) hypertension: Secondary | ICD-10-CM | POA: Diagnosis not present

## 2021-03-15 DIAGNOSIS — I480 Paroxysmal atrial fibrillation: Secondary | ICD-10-CM | POA: Diagnosis not present

## 2021-03-15 DIAGNOSIS — E7800 Pure hypercholesterolemia, unspecified: Secondary | ICD-10-CM | POA: Diagnosis not present

## 2021-03-15 DIAGNOSIS — I495 Sick sinus syndrome: Secondary | ICD-10-CM

## 2021-03-15 NOTE — Progress Notes (Signed)
Primary Physician/Referring:  Shon Baton, MD  Patient ID: Matthew Cannon, male    DOB: 1941/01/16, 81 y.o.   MRN: [id number]  No chief complaint on file.    HPI: Matthew Cannon  is a 81 y.o. male  Caucasian male with paroxysmal atrial fibrillation, obstructive sleep apnea and compliant with CPAP and follows Dr. Brett Fairy, has been on Xarelto since 2016, last documented A. Fib on 01/28/2018.  He is tolerating anticoagulation without any bleeding diathesis.  He presents for 6-week visit, states that since changing medications to Benicar HCT, blood pressure has been well controlled.  He is also tolerating atorvastatin without any side effects.  He continues to exercise regularly, denies any fatigue, dyspnea or chest pain.   Past Medical History:  Diagnosis Date   Cancer ([place])    Melanoma- forehead and cheek - left side of face   Left atrial dilatation    Sinus bradycardia    Syncope    Tachycardia-bradycardia syndrome ([place]) 08/09/2018    Past Surgical History:  Procedure Laterality Date   ADENOIDECTOMY     COLONOSCOPY     COLONOSCOPY W/ POLYPECTOMY     DISTAL BICEPS TENDON REPAIR Right 05/18/2017   Procedure: DISTAL BICEPS TENDON REPAIR;  Surgeon: Iran Planas, MD;  Location: [facility];  Service: Orthopedics;  Laterality: Right;   FOOT SURGERY Left    had bone spur   INGUINAL HERNIA REPAIR Bilateral 12/15/2012   Procedure: LAPAROSCOPIC BILATERAL INGUINAL HERNIA LEFT DIRECT AND INDIRECT HERNIA  AND RIGHT DIRECT HERNIA WITH MESH;  Surgeon: Gayland Curry, MD;  Location[facility];  Service: General;  Laterality: Bilateral;   INSERTION OF MESH Bilateral 12/15/2012   Procedure: INSERTION OF MESH BILATERAL;  Surgeon: Gayland Curry, MD;  Location: [facility];  Service: General;  Laterality: Bilateral;  INGUINAL   melanoma removal  2016   forehead, left face cheek   TONSILLECTOMY     VASECTOMY     Social History   Tobacco Use   Smoking status: Former    Packs/day: 1.00    Years: 23.00     Pack years: 23.00    Types: Cigarettes    Quit date: 03/06/1981    Years since quitting: 40.0   Smokeless tobacco: Never  Substance Use Topics   Alcohol use: Yes    Alcohol/week: 4.0 standard drinks    Types: 1 Standard drinks or equivalent, 3 Glasses of wine per week    Comment: 3 x week, 1-2 wine or a beer, on Friday nights a Gin Martini   Marital Status: Married   Review of Systems  Cardiovascular:  Negative for chest pain, dyspnea on exertion and leg swelling.  Gastrointestinal:  Negative for melena.  Objective   Vitals with BMI 03/15/2021 03/15/2021 02/14/2021  Height - 5' 10"  5' 10"   Weight - 187 lbs 189 lbs  BMI - 55.97 41.63  Systolic 845 364 680  Diastolic 72 82 69  Pulse - 59 57    Physical Exam Neck:     Vascular: Carotid bruit (bilateral R>L) present. No JVD.  Cardiovascular:     Rate and Rhythm: Regular rhythm. Bradycardia present.     Pulses: Intact distal pulses.     Heart sounds: Normal heart sounds. No murmur heard.   No gallop.  Pulmonary:     Effort: Pulmonary effort is normal.     Breath sounds: Normal breath sounds.  Abdominal:     General: Bowel sounds are normal.  Palpations: Abdomen is soft.  Musculoskeletal:        General: No swelling.   Radiology: No results found.  Laboratory examination:   BMP Latest Ref Rng & Units 03/08/2021 01/28/2018 05/13/2017  Glucose 70 - 99 mg/dL 105(H) 103(H) 95  BUN 8 - 27 mg/dL 24 23 19   Creatinine 0.76 - 1.27 mg/dL 1.23 1.49(H) 1.37(H)  BUN/Creat Ratio 10 - 24 20 - -  Sodium 134 - 144 mmol/L 140 140 139  Potassium 3.5 - 5.2 mmol/L 4.6 4.3 4.4  Chloride 96 - 106 mmol/L 102 105 105  CO2 20 - 29 mmol/L 24 28 22   Calcium 8.6 - 10.2 mg/dL 9.1 9.1 9.4    Lipid Panel Recent Labs    03/08/21 0845  CHOL 146  TRIG 99  LDLCALC 76  HDL 52    External labs:  Cholesterol, total 180.000 m 10/13/2019 HDL 53.000 mg 10/13/2019 LDL-C 116.000 m 10/13/2019 Triglycerides 58.000 mg 10/13/2019  A1C 5.700 %  10/13/2019 TSH 2.460 04/11/2019   Hemoglobin 15.800 g/d 19/10/2019  Creatinine, Serum 1.120 MG/ 10/13/2019 Potassium 4.300 mm 01/28/2018 ALT (SGPT) 18.000 IU/ 10/13/2019  Medications   Current Outpatient Medications  Medication Instructions   atorvastatin (LIPITOR) 10 mg, Oral, Daily   olmesartan-hydrochlorothiazide (BENICAR HCT) 20-12.5 MG tablet 1 tablet, Oral, BH-each morning   XARELTO 15 MG TABS tablet TAKE 1 TABLET(15 MG) BY MOUTH DAILY WITH SUPPER   Cardiac Studies:   Echocardiogram 03/20/2015: Left ventricle cavity is normal in size. Mild concentric hypertrophy of the left ventricle. Normal global wall motion. Doppler evidence of grade I (impaired) diastolic dysfunction. Calculated EF 55%. Left atrial cavity is moderately dilated at 4.8 cm. Mild prolapse of the mitral valve leaflets. No significant myxomatous changes noted. Mild mitral regurgitation. Mild tricuspid regurgitation. No evidence of pulmonary hypertension. Mild pulmonic regurgitation.  Treadmill exercise stress test 03/19/2015: Indication: Syncope The patient exercised according to Bruce Protocol, Total time recorded  9:09 min achieving max heart rate of   148which was  102% of MPHR for age and  10.16 METS of work. Resting ECG showing NSR.  Normal BP response. There was no ST-T changes of ischemia with exercise stress test. Stress terminated due to  [facility] met. and mild dyspnea.  No arrhythmias. Excellent effort.  Carotid artery duplex 01/08/2021: Duplex suggests stenosis in the right internal carotid artery (minimal). Duplex suggests stenosis in the right external carotid artery (>50%). Duplex suggests stenosis in the left internal carotid artery (minimal). Duplex suggests stenosis in the left external carotid artery (<50%). There is mild heterogenous plaque noted in bilateral carotid arteries. Antegrade right vertebral artery flow. Antegrade left vertebral artery flow. No significant change from 10/29/2017. Follow up is  appropriate if clinically indicated.  EKG:   EKG 03/15/2020: Sinus bradycardia at a rate of 51 bpm, left atrial enlargement, otherwise normal EKG. NO SIGNIFICANT CHANGE FROM 02/14/2021, HEART RATE WAS 58 BPM.     Assessment     ICD-10-CM   1. Paroxysmal atrial fibrillation (HCC)  I48.0 EKG 12-Lead    2. Sinus node dysfunction (HCC)  I49.5     3. Primary hypertension  I10     4. Pure hypercholesterolemia  E78.00       CHA2DS2-VASc Score is 4.  Yearly risk of stroke: 4.8% (A, HTN, carotid atherosclerosis).  Score of 1=0.6; 2=2.2; 3=3.2; 4=4.8; 5=7.2; 6=9.8; 7=>9.8) -(CHF; HTN; vasc disease DM,  Male = 1; Age <65 =0; 65-74 = 1,  >75 =2; stroke/embolism= 2).    Recommendations:  KHALIN ROYCE is a 81 y.o. Caucasian male with paroxysmal atrial fibrillation, obstructive sleep apnea and compliant with CPAP and follows Dr. Brett Fairy, has been on Xarelto since 2016, last documented A. Fib on 01/28/2018.  He is tolerating anticoagulation without any bleeding diathesis.  Patient is very particular about any medications, on his last office visit after extensive discussions had started him on atorvastatin for carotid atherosclerosis and hyperlipidemia and also changed from Procardia XL due to leg edema to Benicar HCT.  I reviewed the results of the lipid panel, excellent control of lipids.  I would like to have LDL closer to 70 however he is 81 years of age and we have achieved LDL to less than 100 previously greater than 100.  I reviewed with him regarding serum creatinine, his creatinine has been hovering between 1.2-1.4, no change in serum creatinine with Benicar HCT 20/12.5 mg daily.  As his blood pressure is well controlled, he remains asymptomatic, I will see him back on an annual basis.  He continues to maintain sinus bradycardia and as he is asymptomatic no pacemaker is indicated at this time.    Adrian Prows, MD, [facility] 03/15/2021, 10:42 AM Office: [phone] Pager: [phone]

## 2021-05-10 ENCOUNTER — Other Ambulatory Visit: Payer: Self-pay | Admitting: Cardiology

## 2021-05-10 DIAGNOSIS — I6523 Occlusion and stenosis of bilateral carotid arteries: Secondary | ICD-10-CM

## 2021-05-10 DIAGNOSIS — E785 Hyperlipidemia, unspecified: Secondary | ICD-10-CM

## 2021-05-13 ENCOUNTER — Telehealth: Payer: Self-pay | Admitting: Cardiology

## 2021-05-13 ENCOUNTER — Other Ambulatory Visit: Payer: Self-pay

## 2021-05-13 DIAGNOSIS — I6523 Occlusion and stenosis of bilateral carotid arteries: Secondary | ICD-10-CM

## 2021-05-13 DIAGNOSIS — E785 Hyperlipidemia, unspecified: Secondary | ICD-10-CM

## 2021-05-13 MED ORDER — ATORVASTATIN CALCIUM 10 MG PO TABS: ORAL_TABLET | ORAL | 2 refills | Status: DC

## 2021-05-13 NOTE — Telephone Encounter (Signed)
Patient needs refill for atorvastatin; says someone needs to give his pharmacy permission to refill it.  ?

## 2021-05-13 NOTE — Telephone Encounter (Signed)
Refill has been sent to pts pharmacy.

## 2021-05-15 ENCOUNTER — Other Ambulatory Visit: Payer: Self-pay | Admitting: Cardiology

## 2021-05-15 DIAGNOSIS — E785 Hyperlipidemia, unspecified: Secondary | ICD-10-CM

## 2021-05-15 DIAGNOSIS — I6523 Occlusion and stenosis of bilateral carotid arteries: Secondary | ICD-10-CM

## 2021-05-20 ENCOUNTER — Other Ambulatory Visit: Payer: Self-pay | Admitting: Cardiology

## 2021-05-20 DIAGNOSIS — I10 Essential (primary) hypertension: Secondary | ICD-10-CM

## 2021-05-22 ENCOUNTER — Other Ambulatory Visit: Payer: Self-pay | Admitting: Cardiology

## 2021-05-22 DIAGNOSIS — I10 Essential (primary) hypertension: Secondary | ICD-10-CM

## 2021-06-03 DIAGNOSIS — C439 Malignant melanoma of skin, unspecified: Secondary | ICD-10-CM | POA: Diagnosis not present

## 2021-06-03 DIAGNOSIS — I480 Paroxysmal atrial fibrillation: Secondary | ICD-10-CM | POA: Diagnosis not present

## 2021-06-03 DIAGNOSIS — R739 Hyperglycemia, unspecified: Secondary | ICD-10-CM | POA: Diagnosis not present

## 2021-06-03 DIAGNOSIS — D6869 Other thrombophilia: Secondary | ICD-10-CM | POA: Diagnosis not present

## 2021-06-10 DIAGNOSIS — S70262A Insect bite (nonvenomous), left hip, initial encounter: Secondary | ICD-10-CM | POA: Diagnosis not present

## 2021-06-10 DIAGNOSIS — W57XXXA Bitten or stung by nonvenomous insect and other nonvenomous arthropods, initial encounter: Secondary | ICD-10-CM | POA: Diagnosis not present

## 2021-07-16 ENCOUNTER — Other Ambulatory Visit: Payer: Self-pay | Admitting: Cardiology

## 2021-07-16 DIAGNOSIS — I480 Paroxysmal atrial fibrillation: Secondary | ICD-10-CM

## 2021-07-23 DIAGNOSIS — G4733 Obstructive sleep apnea (adult) (pediatric): Secondary | ICD-10-CM | POA: Diagnosis not present

## 2021-08-09 ENCOUNTER — Other Ambulatory Visit: Payer: Self-pay | Admitting: Cardiology

## 2021-08-09 DIAGNOSIS — I6523 Occlusion and stenosis of bilateral carotid arteries: Secondary | ICD-10-CM

## 2021-08-09 DIAGNOSIS — E785 Hyperlipidemia, unspecified: Secondary | ICD-10-CM

## 2021-08-22 DIAGNOSIS — G4733 Obstructive sleep apnea (adult) (pediatric): Secondary | ICD-10-CM | POA: Diagnosis not present

## 2021-09-22 DIAGNOSIS — G4733 Obstructive sleep apnea (adult) (pediatric): Secondary | ICD-10-CM | POA: Diagnosis not present

## 2021-10-09 ENCOUNTER — Other Ambulatory Visit: Payer: Self-pay | Admitting: Cardiology

## 2021-10-09 DIAGNOSIS — I10 Essential (primary) hypertension: Secondary | ICD-10-CM

## 2021-11-06 ENCOUNTER — Other Ambulatory Visit: Payer: Self-pay | Admitting: Cardiology

## 2021-11-06 DIAGNOSIS — I6523 Occlusion and stenosis of bilateral carotid arteries: Secondary | ICD-10-CM

## 2021-11-06 DIAGNOSIS — E785 Hyperlipidemia, unspecified: Secondary | ICD-10-CM

## 2021-11-06 NOTE — Patient Instructions (Signed)

## 2021-11-06 NOTE — Progress Notes (Signed)
PATIENT: Matthew Cannon DOB: 1940/08/06  REASON FOR VISIT: follow up HISTORY FROM: patient  Chief Complaint  Patient presents with   Follow-up    Pt in room #2 and alone. Pt here today for f/u on his CPAP.    HISTORY OF PRESENT ILLNESS:  11/11/21 ALL: Matthew Cannon returns for follow up for OSA on CPAP. He continues to do well on therapy. He is using CPAP nightly for about 7 hours. He is using a FFM. No concerns with machine or supplies.     03/12/21 ALL: Matthew Cannon is a 81 y.o. male here today for follow up for OSA on CPAP. He recently received a new CPAP machine and returns for initial compliance report. He is doing very well. He is using his new machine nightly for at least 4 hours. He feels he may be sleeping better than with his old machine.        11/05/2020 ALL: Matthew Cannon returns for follow up for OSA on CPAP. He continues to do well. Orders for travel machine placed in 06-09-2020 but he did not pursue. He was able to use his home CPAP with travel. He continues to do very well on CPAP. He is sleeping "great". He denies concerns with machine or supplies. His machine was set up 06/2015.     11/03/2019 ALL:  Matthew Cannon is a 81 y.o. male here today for follow up for OSA on CPAP.  He is doing very well on CPAP therapy.  He does admit that it is not comfortable, however, he does note improvement in sleep quality when he is using CPAP therapy.  He denies any concerns with CPAP machine or supplies.  Compliance report dated 10/03/2019 through 11/01/2019 reveals that he used CPAP 30 of the past 30 days for compliance of 100%.  He used CPAP greater than 4 hours all 30 days.  Average usage was 7 hours and 21 minutes.  Residual AHI was 2.5 on 5 to 12 cm of water and an EPR of 3.  There was no significant leak noted.  HISTORY: (copied from Dr Dohmeier's note on 11/01/2018)   HPI:  Matthew Cannon is a 81 y.o. male , seen here as a referral from Dr. Einar Gip for a sleep  evaluation, to rule out in the setting of atrial fibrillation. His AHI in 2017 was 37.6/h . December 2019 he had break through atrial fib. Liver impairment has forced a reduction in xeralto dose.    Rv 11-01-2018- Matthew Cannon reports not feeling fatigued or depressed, using his CPAO compliantly.  He is married, and he and his spouse have limited their social activities, shopping and not going out much. They communicate by face time.  His eldest daughter passed away in 06-10-22 from Breast cancer at age 5.   Matthew Cannon has been 100% compliant CPAP user with an average user time of 7 hours and 2 minutes.  Minimum pressure is 5 and maximum pressure setting 12 cmH2O with an EPR of 3 cmH2O.  His air leak is very low, his residual events per hour or 2.1 which is an excellent resolution.  He has mostly obstructive apneas left but also 4 minutes per night and Cheyne-Stokes respirations.  Central apneas seem to be breaking through on the CPAP.  Fatigue severity scale was endorsed at 16 and his Epworth sleepiness score at 7, the geriatric depression score at 1 point out of 15. No changes necessary.    2-30-2017.The patient had  undergone a 30 day cardiac monitor , which captured 4 atrial fib attacks, 3 in sleep. He started on 3 medications , which he doesn't tolerate well. He feels sick and clammy on the medications. He has chronic rhinitis and sinusitis.  Matthew Cannon has undergone 2 Mohs surgeries, one on the left neck just below the lower jaw and one on the high forehead. This will be important should CPAP treatment be indicated after our evaluation is completed.   Matthew Cannon medical records speak of a cardiac workup following syncope spells ; there were 3 near syncopal episodes in October - November 2016 the first one occurred while he was still working in the heat outside and he may have been dehydrated or overheated, the second and third Near-syncope occurred inside without any exertion. The  symptoms lasted each time between 4 and 5 minutes or even less. He had no chest pain or tightness and no shortness of breath associated Dr. Irven Shelling workup included an event monitor and echocardiogram and a cardiac stress test he continued to have intermittent episodes of dizziness.   10-29-2017, RV on CPAP.  Matthew Cannon is seen here today is a 81 year old male patient originally referred by Dr. Einar Gip.  He has for several months not needed any kind of beta-blocker to control his heart rate and had no syncopal episodes or near syncope's.  He also did not receive a pacemaker as his heart rate is now in the low normal range. The patient is a CPAP patient who is using an autotitrator after being diagnosed with obstructive sleep apnea he has been 100% compliant over the last 90 days and 30 days.  His 30-day download shows an average use at time of 7 hours and 38 minutes at night pressure window is between 5 and 10 cmH2O pressure with 3 cm expiratory relief.  His residual AHI is 2.6 which speaks for an excellent resolution.  He does have only minor air leakage and the 95th percentile pressure is 9.6.  I would actually like to increase his maximum pressure window by 2 cm to 12 cm water.   Sleep habits are as follows:The patient usually goes to bed between 10 and 11 PM, he is currently using one small pillow but used multiple pillows as a head rest before. He prefers the supine sleep position, the bedroom is cool, quiet and dark. He has nocturia 4-6 times each night- now 3-4 times a night , his sinus blockage makes breathing difficulties and he snores.  He drinks a lot of water. He rises at 7 AM , often already awake after 5 AM. He doesn't wake with or from headaches and he is usually well refreshed. No alarm needed. He feels not restless, only sometimes light headed and that's not the case for month now.    Sleep medical history and family sleep history: 10-29-2017,  Atrial fib, seen by Dr. Rayann Heman - has not  needed a pacemaker he reports, as heart rate has not been lower than 40 bpm, yet he had several syncope episodes. Reduced Toprol dose.  In the meantime developed a masked face and hoarse voice, no tremor.    Social history: married, retired, 2 sisters, 3 adult children. Non smoker , 3 glasses of ETOH per week. His current medications include Xarelto, lisinopril he is now also on flecainide and metoprolol. The patient has a very remote smoking history of one pack per day until1983. He is a modest alcohol drinker with 3 drinks per week  and does not use caffeine aged beverages.     REVIEW OF SYSTEMS: Out of a complete 14 system review of symptoms, the patient complains only of the following symptoms, none and all other reviewed systems are negative.   ALLERGIES: Allergies  Allergen Reactions   Losartan Other (See Comments)    Dizziness and fatigue   Zithromax [Azithromycin] Diarrhea    HOME MEDICATIONS: Outpatient Medications Prior to Visit  Medication Sig Dispense Refill   atorvastatin (LIPITOR) 10 MG tablet TAKE 1 TABLET(10 MG) BY MOUTH DAILY 90 tablet 0   olmesartan-hydrochlorothiazide (BENICAR HCT) 20-12.5 MG tablet TAKE 1 TABLET BY MOUTH EVERY MORNING 30 tablet 2   XARELTO 15 MG TABS tablet TAKE 1 TABLET(15 MG) BY MOUTH DAILY WITH SUPPER 90 tablet 3   No facility-administered medications prior to visit.    PAST MEDICAL HISTORY: Past Medical History:  Diagnosis Date   Cancer ([place])    Melanoma- forehead and cheek - left side of face   Left atrial dilatation    Sinus bradycardia    Syncope    Tachycardia-bradycardia syndrome ([place]) 08/09/2018    PAST SURGICAL HISTORY: Past Surgical History:  Procedure Laterality Date   ADENOIDECTOMY     COLONOSCOPY     COLONOSCOPY W/ POLYPECTOMY     DISTAL BICEPS TENDON REPAIR Right 05/18/2017   Procedure: DISTAL BICEPS TENDON REPAIR;  Surgeon: Iran Planas, MD;  Location: [facility];  Service: Orthopedics;  Laterality: Right;   FOOT SURGERY Left     had bone spur   INGUINAL HERNIA REPAIR Bilateral 12/15/2012   Procedure: LAPAROSCOPIC BILATERAL INGUINAL HERNIA LEFT DIRECT AND INDIRECT HERNIA  AND RIGHT DIRECT HERNIA WITH MESH;  Surgeon: Gayland Curry, MD;  Location: [facility];  Service: General;  Laterality: Bilateral;   INSERTION OF MESH Bilateral 12/15/2012   Procedure: INSERTION OF MESH BILATERAL;  Surgeon: Gayland Curry, MD;  Location: [facility];  Service: General;  Laterality: Bilateral;  INGUINAL   melanoma removal  2016   forehead, left face cheek   TONSILLECTOMY     VASECTOMY      FAMILY HISTORY: Family History  Problem Relation Age of Onset   Suicidality Father    Alcohol abuse Father    Cancer Daughter        breast    SOCIAL HISTORY: Social History   Socioeconomic History   Marital status: Married    Spouse name: Not on file   Number of children: 3   Years of education: Not on file   Highest education level: Not on file  Occupational History   Not on file  Tobacco Use   Smoking status: Former    Packs/day: 1.00    Years: 23.00    Total pack years: 23.00    Types: Cigarettes    Quit date: 03/06/1981    Years since quitting: 40.7   Smokeless tobacco: Never  Vaping Use   Vaping Use: Never used  Substance and Sexual Activity   Alcohol use: Yes    Alcohol/week: 4.0 standard drinks of alcohol    Types: 1 Standard drinks or equivalent, 3 Glasses of wine per week    Comment: 3 x week, 1-2 wine or a beer, on Friday nights a Gin Martini   Drug use: No   Sexual activity: Not on file  Other Topics Concern   Not on file  Social History Narrative   Lives in [place] with spouse.  Retired from [employer]  Financial Resource Strain: Not on file  Food Insecurity: Not on file  Transportation Needs: Not on file  Physical Activity: Not on file  Stress: Not on file  Social Connections: Not on file  Intimate Partner Violence: Not on file      PHYSICAL EXAM  Vitals:    11/11/21 0832  BP: (!) 151/64  Pulse: (!) 50  Weight: 186 lb (84.4 kg)  Height: '5\' 10"'$  (1.778 m)     Body mass index is 26.69 kg/m.  Generalized: Well developed, in no acute distress  Cardiology: normal rate and rhythm, no murmur noted Respiratory: clear to auscultation bilaterally  Neurological examination  Mentation: Alert oriented to time, place, history taking. Follows all commands speech and language fluent Cranial nerve II-XII: Pupils were equal round reactive to light. Extraocular movements were full, visual field were full  Motor: The motor testing reveals 5 over 5 strength of all 4 extremities. Good symmetric motor tone is noted throughout.  Gait and station: Gait is normal.    DIAGNOSTIC DATA (LABS, IMAGING, TESTING) - I reviewed patient records, labs, notes, testing and imaging myself where available.      No data to display           Lab Results  Component Value Date   WBC 9.9 01/28/2018   HGB 15.8 01/28/2018   HCT 48.2 01/28/2018   MCV 93.1 01/28/2018   PLT 268 01/28/2018      Component Value Date/Time   NA 140 03/08/2021 0845   K 4.6 03/08/2021 0845   CL 102 03/08/2021 0845   CO2 24 03/08/2021 0845   GLUCOSE 105 (H) 03/08/2021 0845   GLUCOSE 103 (H) 01/28/2018 1931   BUN 24 03/08/2021 0845   CREATININE 1.23 03/08/2021 0845   CALCIUM 9.1 03/08/2021 0845   PROT 7.4 03/08/2021 0845   ALBUMIN 4.7 (H) 03/08/2021 0845   AST 23 03/08/2021 0845   ALT 19 03/08/2021 0845   ALKPHOS 76 03/08/2021 0845   BILITOT 0.7 03/08/2021 0845   GFRNONAA 44 (L) 01/28/2018 1931   GFRAA 51 (L) 01/28/2018 1931   Lab Results  Component Value Date   CHOL 146 03/08/2021   HDL 52 03/08/2021   LDLCALC 76 03/08/2021   TRIG 99 03/08/2021   No results found for: "HGBA1C" No results found for: "VITAMINB12" No results found for: "TSH"    ASSESSMENT AND PLAN 81 y.o. year old male  has a past medical history of Cancer ([facility]), Left atrial dilatation, Sinus bradycardia,  Syncope, and Tachycardia-bradycardia syndrome ([facility]) (08/09/2018). here with     ICD-10-CM   1. OSA on CPAP  G47.33 For home use only DME continuous positive airway pressure (CPAP)        Matthew Cannon is doing very well on CPAP therapy. Compliance report reveals excellent compliance. He was encouraged to continue using CPAP nightly and for greater than 4 hours each night. Healthy lifestyle habits encouraged.  He will follow-up closely with primary care.  He will follow-up with [place] in 1 year. He verbalizes understanding and agreement with this plan.  Set up date 03/2021  Orders Placed This Encounter  Procedures   For home use only DME continuous positive airway pressure (CPAP)    Supplies    Order Specific Question:   Length of Need    Answer:   Lifetime    Order Specific Question:   Patient has OSA or probable OSA    Answer:   Yes    Order Specific Question:  Is the patient currently using CPAP in the home    Answer:   Yes    Order Specific Question:   Settings    Answer:   Other see comments    Order Specific Question:   CPAP supplies needed    Answer:   Mask, headgear, cushions, filters, heated tubing and water chamber      No orders of the defined types were placed in this encounter.      Debbora Presto, FNP-C 11/11/2021, 8:55 AM [facility] [address] [place], [place] [address] [phone]

## 2021-11-09 DIAGNOSIS — Z23 Encounter for immunization: Secondary | ICD-10-CM | POA: Diagnosis not present

## 2021-11-11 ENCOUNTER — Encounter: Payer: Self-pay | Admitting: Family Medicine

## 2021-11-11 ENCOUNTER — Ambulatory Visit: Payer: Medicare Other | Admitting: Family Medicine

## 2021-11-11 VITALS — BP 151/64 | HR 50 | Ht 70.0 in | Wt 186.0 lb

## 2021-11-11 DIAGNOSIS — G4733 Obstructive sleep apnea (adult) (pediatric): Secondary | ICD-10-CM

## 2021-11-12 ENCOUNTER — Telehealth: Payer: Self-pay

## 2021-11-15 DIAGNOSIS — L570 Actinic keratosis: Secondary | ICD-10-CM | POA: Diagnosis not present

## 2021-11-15 DIAGNOSIS — C44612 Basal cell carcinoma of skin of right upper limb, including shoulder: Secondary | ICD-10-CM | POA: Diagnosis not present

## 2021-11-15 DIAGNOSIS — D0462 Carcinoma in situ of skin of left upper limb, including shoulder: Secondary | ICD-10-CM | POA: Diagnosis not present

## 2021-11-15 DIAGNOSIS — L821 Other seborrheic keratosis: Secondary | ICD-10-CM | POA: Diagnosis not present

## 2021-11-15 DIAGNOSIS — D485 Neoplasm of uncertain behavior of skin: Secondary | ICD-10-CM | POA: Diagnosis not present

## 2021-11-15 DIAGNOSIS — D0461 Carcinoma in situ of skin of right upper limb, including shoulder: Secondary | ICD-10-CM | POA: Diagnosis not present

## 2021-11-15 DIAGNOSIS — Z85828 Personal history of other malignant neoplasm of skin: Secondary | ICD-10-CM | POA: Diagnosis not present

## 2021-11-15 DIAGNOSIS — C44629 Squamous cell carcinoma of skin of left upper limb, including shoulder: Secondary | ICD-10-CM | POA: Diagnosis not present

## 2021-11-18 DIAGNOSIS — I10 Essential (primary) hypertension: Secondary | ICD-10-CM | POA: Diagnosis not present

## 2021-11-18 DIAGNOSIS — R739 Hyperglycemia, unspecified: Secondary | ICD-10-CM | POA: Diagnosis not present

## 2021-11-18 DIAGNOSIS — E785 Hyperlipidemia, unspecified: Secondary | ICD-10-CM | POA: Diagnosis not present

## 2021-11-18 DIAGNOSIS — Z125 Encounter for screening for malignant neoplasm of prostate: Secondary | ICD-10-CM | POA: Diagnosis not present

## 2021-11-25 DIAGNOSIS — Z1331 Encounter for screening for depression: Secondary | ICD-10-CM | POA: Diagnosis not present

## 2021-11-25 DIAGNOSIS — C439 Malignant melanoma of skin, unspecified: Secondary | ICD-10-CM | POA: Diagnosis not present

## 2021-11-25 DIAGNOSIS — D6869 Other thrombophilia: Secondary | ICD-10-CM | POA: Diagnosis not present

## 2021-11-25 DIAGNOSIS — Z1389 Encounter for screening for other disorder: Secondary | ICD-10-CM | POA: Diagnosis not present

## 2021-11-25 DIAGNOSIS — R82998 Other abnormal findings in urine: Secondary | ICD-10-CM | POA: Diagnosis not present

## 2021-11-25 DIAGNOSIS — Z0000 Encounter for general adult medical examination without abnormal findings: Secondary | ICD-10-CM | POA: Diagnosis not present

## 2021-11-25 DIAGNOSIS — I480 Paroxysmal atrial fibrillation: Secondary | ICD-10-CM | POA: Diagnosis not present

## 2021-12-13 DIAGNOSIS — L738 Other specified follicular disorders: Secondary | ICD-10-CM | POA: Diagnosis not present

## 2021-12-13 DIAGNOSIS — Z85828 Personal history of other malignant neoplasm of skin: Secondary | ICD-10-CM | POA: Diagnosis not present

## 2021-12-13 DIAGNOSIS — Z79899 Other long term (current) drug therapy: Secondary | ICD-10-CM | POA: Diagnosis not present

## 2021-12-13 DIAGNOSIS — L739 Follicular disorder, unspecified: Secondary | ICD-10-CM | POA: Diagnosis not present

## 2021-12-29 ENCOUNTER — Other Ambulatory Visit: Payer: Self-pay | Admitting: Cardiology

## 2021-12-29 DIAGNOSIS — I10 Essential (primary) hypertension: Secondary | ICD-10-CM

## 2022-01-22 ENCOUNTER — Telehealth: Payer: Medicare Other | Admitting: Family Medicine

## 2022-01-22 DIAGNOSIS — B9689 Other specified bacterial agents as the cause of diseases classified elsewhere: Secondary | ICD-10-CM

## 2022-01-22 DIAGNOSIS — J019 Acute sinusitis, unspecified: Secondary | ICD-10-CM | POA: Diagnosis not present

## 2022-01-22 MED ORDER — AMOXICILLIN-POT CLAVULANATE 875-125 MG PO TABS: 1.0000 | ORAL_TABLET | Freq: Two times a day (BID) | ORAL | 0 refills | Status: AC

## 2022-01-22 NOTE — Progress Notes (Signed)
Virtual Visit Consent   Matthew Cannon, you are scheduled for a virtual visit with a [facility] provider today. Just as with appointments in the office, your consent must be obtained to participate. Your consent will be active for this visit and any virtual visit you may have with one of our providers in the next 365 days. If you have a MyChart account, a copy of this consent can be sent to you electronically.  As this is a virtual visit, video technology does not allow for your provider to perform a traditional examination. This may limit your provider's ability to fully assess your condition. If your provider identifies any concerns that need to be evaluated in person or the need to arrange testing (such as labs, EKG, etc.), we will make arrangements to do so. Although advances in technology are sophisticated, we cannot ensure that it will always work on either your end or our end. If the connection with a video visit is poor, the visit may have to be switched to a telephone visit. With either a video or telephone visit, we are not always able to ensure that we have a secure connection.  By engaging in this virtual visit, you consent to the provision of healthcare and authorize for your insurance to be billed (if applicable) for the services provided during this visit. Depending on your insurance coverage, you may receive a charge related to this service.  I need to obtain your verbal consent now. Are you willing to proceed with your visit today? [facility] has provided verbal consent on 01/22/2022 for a virtual visit (video or telephone). Perlie Mayo, NP  Date: 01/22/2022 11:23 AM  Virtual Visit via audio Note   I, Perlie Mayo, connected with  Matthew Cannon  ([id number], Sep 17, 1940) on 01/22/22 at 11:15 AM EST by a audio-enabled telemedicine application and verified that I am speaking with the correct person using two identifiers.  Location: Patient: Virtual Visit  Location Patient: Home Provider: Virtual Visit Location Provider: Home Office   I discussed the limitations of evaluation and management by telemedicine and the availability of in person appointments. The patient expressed understanding and agreed to proceed.    History of Present Illness: Matthew Cannon is a 81 y.o. who identifies as a male who was assigned male at birth, and is being seen today for last Thursday started with chest cold- runny nose- chest congestion- tan mucus. Has been taken low dose decongestants and resting. Denies fever, chills, chest pain, shortness of breath, sore throat.   COVID negative home test during visit.  Problems:  Patient Active Problem List   Diagnosis Date Noted   Primary hypertension 03/15/2021   Pure hypercholesterolemia 03/15/2021   Sinus node dysfunction ([facility]) 08/09/2018   Stage 3 chronic kidney disease ([place]) 08/09/2018   OSA on CPAP 08/20/2015   Bradycardia 07/30/2015   Snoring 04/23/2015   Hypersomnia with sleep apnea 04/23/2015   Paroxysmal atrial fibrillation ([facility] [place]) 04/23/2015   Nocturia more than twice per night 04/23/2015   Epistaxis 04/23/2015    Allergies:  Allergies  Allergen Reactions   Losartan Other (See Comments)    Dizziness and fatigue   Zithromax [Azithromycin] Diarrhea   Medications:  Current Outpatient Medications:    atorvastatin (LIPITOR) 10 MG tablet, TAKE 1 TABLET(10 MG) BY MOUTH DAILY, Disp: 90 tablet, Rfl: 0   olmesartan-hydrochlorothiazide (BENICAR HCT) 20-12.5 MG tablet, TAKE 1 TABLET BY MOUTH EVERY MORNING, Disp: 90 tablet, Rfl: 3   XARELTO  15 MG TABS tablet, TAKE 1 TABLET(15 MG) BY MOUTH DAILY WITH SUPPER, Disp: 90 tablet, Rfl: 3  Observations/Objective: Patient is well-developed, well-nourished in no acute distress.  Resting comfortably  at home.  Head is normocephalic, atraumatic.  No labored breathing.  Speech is clear and coherent with logical content.  Patient is alert and oriented at baseline.     Assessment and Plan:  1. Acute bacterial sinusitis  - amoxicillin-clavulanate (AUGMENTIN) 875-125 MG tablet; Take 1 tablet by mouth 2 (two) times daily for 7 days.  Dispense: 14 tablet; Refill: 0  -low suspicion for CAP- given duration and viral like symptoms at start- will cover with Augmentin with low threshold to be seen in person if symptoms progress-   -Take meds as prescribed -Rest -Use a cool mist humidifier especially during the winter months when heat dries out the air. - Use saline nose sprays frequently to help soothe nasal passages and promote drainage. -Saline irrigations of the nose can be very helpful if done frequently.             * 4X daily for 1 week*             * Use of a nettie pot can be helpful with this.  *Follow directions with this* *Boiled or distilled water only -stay hydrated by drinking plenty of fluids - Keep thermostat turn down low to prevent drying out sinuses - For any cough or congestion- robitussin DM or Delsym as needed - For fever or aches or pains- take tylenol or ibuprofen as directed on bottle             * for fevers greater than 101 orally you may alternate ibuprofen and tylenol every 3 hours.  If you do not improve you will need a follow up visit in person.                Reviewed side effects, risks and benefits of medication.    Patient acknowledged agreement and understanding of the plan.   Past Medical, Surgical, Social History, Allergies, and Medications have been Reviewed.     Follow Up Instructions: I discussed the assessment and treatment plan with the patient. The patient was provided an opportunity to ask questions and all were answered. The patient agreed with the plan and demonstrated an understanding of the instructions.  A copy of instructions were sent to the patient via MyChart unless otherwise noted below.     The patient was advised to call back or seek an in-person evaluation if the symptoms worsen or if  the condition fails to improve as anticipated.  Time:  I spent 10 minutes with the patient via telehealth technology discussing the above problems/concerns.    Perlie Mayo, NP

## 2022-01-22 NOTE — Patient Instructions (Addendum)
  [name], thank you for joining Perlie Mayo, NP for today's virtual visit.  While this provider is not your primary care provider (PCP), if your PCP is located in our provider database this encounter information will be shared with them immediately following your visit.   [facility] account gives you access to today's visit and all your visits, tests, and labs performed at [facility] " click here if you don't have a [facility] account or go to [email or web address]  Consent: (Patient) [name] provided verbal consent for this virtual visit at the beginning of the encounter.  Current Medications:  Current Outpatient Medications:    atorvastatin (LIPITOR) 10 MG tablet, TAKE 1 TABLET(10 MG) BY MOUTH DAILY, Disp: 90 tablet, Rfl: 0   olmesartan-hydrochlorothiazide (BENICAR HCT) 20-12.5 MG tablet, TAKE 1 TABLET BY MOUTH EVERY MORNING, Disp: 90 tablet, Rfl: 3   XARELTO 15 MG TABS tablet, TAKE 1 TABLET(15 MG) BY MOUTH DAILY WITH SUPPER, Disp: 90 tablet, Rfl: 3   Medications ordered in this encounter:  No orders of the defined types were placed in this encounter.    *If you need refills on other medications prior to your next appointment, please contact your pharmacy*  Follow-Up: Call back or seek an in-person evaluation if the symptoms worsen or if the condition fails to improve as anticipated.  [facility] [phone]  Other Instructions  -Take meds as prescribed -Rest -Use a cool mist humidifier especially during the winter months when heat dries out the air. - Use saline nose sprays frequently to help soothe nasal passages and promote drainage. -Saline irrigations of the nose can be very helpful if done frequently.             * 4X daily for 1 week*             * Use of a nettie pot can be helpful with this.  *Follow directions with this* *Boiled or distilled water only -stay hydrated by drinking plenty of  fluids - Keep thermostat turn down low to prevent drying out sinuses - For any cough or congestion- robitussin DM or Delsym as needed - For fever or aches or pains- take tylenol or ibuprofen as directed on bottle             * for fevers greater than 101 orally you may alternate ibuprofen and tylenol every 3 hours.  If you do not improve you will need a follow up visit in person.                 If you have been instructed to have an in-person evaluation today at a local Urgent Care facility, please use the link below. It will take you to a list of all of our available [facility], including address, phone number and hours of operation. Please do not delay care.  [facility]  If you or a family member do not have a primary care provider, use the link below to schedule a visit and establish care. When you choose a [facility] primary care physician or advanced practice provider, you gain a long-term partner in health. Find a Primary Care Provider  Learn more about [facility] in-office and virtual care options: [facility]

## 2022-02-02 ENCOUNTER — Other Ambulatory Visit: Payer: Self-pay | Admitting: Cardiology

## 2022-02-02 DIAGNOSIS — E785 Hyperlipidemia, unspecified: Secondary | ICD-10-CM

## 2022-02-02 DIAGNOSIS — I6523 Occlusion and stenosis of bilateral carotid arteries: Secondary | ICD-10-CM

## 2022-02-03 ENCOUNTER — Other Ambulatory Visit: Payer: Self-pay | Admitting: Cardiology

## 2022-02-03 DIAGNOSIS — I10 Essential (primary) hypertension: Secondary | ICD-10-CM

## 2022-02-18 DIAGNOSIS — H524 Presbyopia: Secondary | ICD-10-CM | POA: Diagnosis not present

## 2022-02-18 DIAGNOSIS — H43813 Vitreous degeneration, bilateral: Secondary | ICD-10-CM | POA: Diagnosis not present

## 2022-02-18 DIAGNOSIS — H02834 Dermatochalasis of left upper eyelid: Secondary | ICD-10-CM | POA: Diagnosis not present

## 2022-02-18 DIAGNOSIS — H25813 Combined forms of age-related cataract, bilateral: Secondary | ICD-10-CM | POA: Diagnosis not present

## 2022-02-18 DIAGNOSIS — H02831 Dermatochalasis of right upper eyelid: Secondary | ICD-10-CM | POA: Diagnosis not present

## 2022-02-19 DIAGNOSIS — Z0100 Encounter for examination of eyes and vision without abnormal findings: Secondary | ICD-10-CM | POA: Diagnosis not present

## 2022-03-17 ENCOUNTER — Ambulatory Visit: Payer: Medicare Other | Admitting: Cardiology

## 2022-03-24 ENCOUNTER — Encounter: Payer: Self-pay | Admitting: Cardiology

## 2022-03-24 ENCOUNTER — Ambulatory Visit: Payer: Medicare Other | Admitting: Cardiology

## 2022-03-24 VITALS — BP 120/68 | HR 53 | Resp 16 | Ht 70.0 in | Wt 183.0 lb

## 2022-03-24 DIAGNOSIS — I10 Essential (primary) hypertension: Secondary | ICD-10-CM | POA: Diagnosis not present

## 2022-03-24 DIAGNOSIS — N1831 Chronic kidney disease, stage 3a: Secondary | ICD-10-CM

## 2022-03-24 DIAGNOSIS — I480 Paroxysmal atrial fibrillation: Secondary | ICD-10-CM

## 2022-03-24 DIAGNOSIS — G4733 Obstructive sleep apnea (adult) (pediatric): Secondary | ICD-10-CM | POA: Diagnosis not present

## 2022-03-24 DIAGNOSIS — I495 Sick sinus syndrome: Secondary | ICD-10-CM | POA: Diagnosis not present

## 2022-03-24 NOTE — Progress Notes (Signed)
Primary Physician/Referring:  Shon Baton, MD  Patient ID: Matthew Cannon, male    DOB: Oct 20, 1940, 82 y.o.   MRN: [id number]  Chief Complaint  Patient presents with   Atrial Fibrillation   Hypertension   Hyperlipidemia   Follow-up    1 year     HPI: Matthew Cannon  is a 82 y.o. male  Caucasian male with paroxysmal atrial fibrillation, obstructive sleep apnea and compliant with CPAP and follows Dr. Brett Fairy, has been on Xarelto since 2016, last documented A. Fib on 01/28/2018.  He is tolerating anticoagulation without any bleeding diathesis.  He presents for annual visit.  He continues to exercise regularly, denies any fatigue, dyspnea or chest pain.   Past Medical History:  Diagnosis Date   Cancer ([place])    Melanoma- forehead and cheek - left side of face   Left atrial dilatation    Sinus bradycardia    Syncope    Tachycardia-bradycardia syndrome ([place]) 08/09/2018    Past Surgical History:  Procedure Laterality Date   ADENOIDECTOMY     COLONOSCOPY     COLONOSCOPY W/ POLYPECTOMY     DISTAL BICEPS TENDON REPAIR Right 05/18/2017   Procedure: DISTAL BICEPS TENDON REPAIR;  Surgeon: Iran Planas, MD;  Location: [place];  Service: Orthopedics;  Laterality: Right;   FOOT SURGERY Left    had bone spur   INGUINAL HERNIA REPAIR Bilateral 12/15/2012   Procedure: LAPAROSCOPIC BILATERAL INGUINAL HERNIA LEFT DIRECT AND INDIRECT HERNIA  AND RIGHT DIRECT HERNIA WITH MESH;  Surgeon: Gayland Curry, MD;  Location: [facility];  Service: General;  Laterality: Bilateral;   INSERTION OF MESH Bilateral 12/15/2012   Procedure: INSERTION OF MESH BILATERAL;  Surgeon: Gayland Curry, MD;  Location: [facility];  Service: General;  Laterality: Bilateral;  INGUINAL   melanoma removal  2016   forehead, left face cheek   TONSILLECTOMY     VASECTOMY     Social History   Tobacco Use   Smoking status: Former    Packs/day: 1.00    Years: 23.00    Total pack years: 23.00    Types: Cigarettes    Quit  date: 03/06/1981    Years since quitting: 41.0   Smokeless tobacco: Never  Substance Use Topics   Alcohol use: Yes    Alcohol/week: 4.0 standard drinks of alcohol    Types: 1 Standard drinks or equivalent, 3 Glasses of wine per week    Comment: 3 x week, 1-2 wine or a beer, on Friday nights a Gin Martini   Marital Status: Married   Review of Systems  Cardiovascular:  Negative for chest pain, dyspnea on exertion and leg swelling.  Gastrointestinal:  Negative for melena.   Objective      03/24/2022   10:17 AM 11/11/2021    8:32 AM 03/15/2021   10:35 AM  Vitals with BMI  Height 5' 10"$  5' 10"$    Weight 183 lbs 186 lbs   BMI [id number]   Systolic [id number] [id number]  Diastolic 68 64 72  Pulse 53 50     Physical Exam Neck:     Vascular: Carotid bruit (bilateral R>L) present. No JVD.  Cardiovascular:     Rate and Rhythm: Regular rhythm. Bradycardia present.     Pulses: Intact distal pulses.     Heart sounds: Normal heart sounds. No murmur heard.    No gallop.  Pulmonary:     Effort: Pulmonary effort is normal.     Breath sounds:  Normal breath sounds.  Abdominal:     General: Bowel sounds are normal.     Palpations: Abdomen is soft.  Musculoskeletal:        General: No swelling.    Radiology: No results found.  Laboratory examination:      Latest Ref Rng & Units 03/08/2021    8:45 AM 01/28/2018    7:31 PM 05/13/2017   11:49 AM  BMP  Glucose 70 - 99 mg/dL 105  103  95   BUN 8 - 27 mg/dL 24  23  19   $ Creatinine 0.76 - 1.27 mg/dL 1.23  1.49  1.37   BUN/Creat Ratio 10 - 24 20     Sodium 134 - 144 mmol/L 140  140  139   Potassium 3.5 - 5.2 mmol/L 4.6  4.3  4.4   Chloride 96 - 106 mmol/L 102  105  105   CO2 20 - 29 mmol/L 24  28  22   $ Calcium 8.6 - 10.2 mg/dL 9.1  9.1  9.4      External labs:  Labs 11/18/2021:  Hb 14.1/HCT 41.4, platelets 222, normal indicis.  A1c 5.6%.  TSH normal at 1.43.  Serum glucose 99 mg, BUN 29, creatinine 1.4, EGFR 48 mL, potassium 5.1, LFTs  normal.  Total cholesterol 131, triglycerides 55, HDL 56, LDL 64.  Medications   Current Outpatient Medications  Medication Instructions   atorvastatin (LIPITOR) 10 MG tablet TAKE 1 TABLET(10 MG) BY MOUTH DAILY   olmesartan-hydrochlorothiazide (BENICAR HCT) 20-12.5 MG tablet 1 tablet, Oral, BH-each morning   XARELTO 15 MG TABS tablet TAKE 1 TABLET(15 MG) BY MOUTH DAILY WITH SUPPER   Cardiac Studies:   Echocardiogram 03/20/2015: Left ventricle cavity is normal in size. Mild concentric hypertrophy of the left ventricle. Normal global wall motion. Doppler evidence of grade I (impaired) diastolic dysfunction. Calculated EF 55%. Left atrial cavity is moderately dilated at 4.8 cm. Mild prolapse of the mitral valve leaflets. No significant myxomatous changes noted. Mild mitral regurgitation. Mild tricuspid regurgitation. No evidence of pulmonary hypertension. Mild pulmonic regurgitation.  Treadmill exercise stress test 03/19/2015: Indication: Syncope The patient exercised according to Bruce Protocol, Total time recorded  9:09 min achieving max heart rate of   148which was  102% of MPHR for age and  10.16 METS of work. Resting ECG showing NSR.  Normal BP response. There was no ST-T changes of ischemia with exercise stress test. Stress terminated due to  [facility] met. and mild dyspnea.  No arrhythmias. Excellent effort.  Carotid artery duplex 01/08/2021: Duplex suggests stenosis in the right internal carotid artery (minimal). Duplex suggests stenosis in the right external carotid artery (>50%). Duplex suggests stenosis in the left internal carotid artery (minimal). Duplex suggests stenosis in the left external carotid artery (<50%). There is mild heterogenous plaque noted in bilateral carotid arteries. Antegrade right vertebral artery flow. Antegrade left vertebral artery flow. No significant change from 10/29/2017. Follow up is appropriate if clinically indicated.  EKG:   EKG 03/24/2022: Normal  sinus rhythm with rate of 52 bpm, normal axis.  Incomplete right bundle branch block.  No evidence of ischemia.  Normal EKG compared compared to 03/15/2021, no significant change.    Assessment     ICD-10-CM   1. Paroxysmal atrial fibrillation (HCC)  I48.0 EKG 12-Lead    2. Primary hypertension  I10     3. Sinus node dysfunction (HCC)  I49.5     4. OSA on CPAP  G47.33  5. Stage 3a chronic kidney disease (HCC)  N18.31       CHA2DS2-VASc Score is 4.  Yearly risk of stroke: 4.8% (A, HTN, carotid atherosclerosis).  Score of 1=0.6; 2=2.2; 3=3.2; 4=4.8; 5=7.2; 6=9.8; 7=>9.8) -(CHF; HTN; vasc disease DM,  Male = 1; Age <65 =0; 65-74 = 1,  >75 =2; stroke/embolism= 2).    Recommendations:   Matthew Cannon is a 82 y.o. Caucasian male with paroxysmal atrial fibrillation, obstructive sleep apnea and compliant with CPAP and follows Dr. Brett Fairy, has been on Xarelto since 2016, last documented A. Fib on 01/28/2018.  He is tolerating anticoagulation without any bleeding diathesis.  1. Paroxysmal atrial fibrillation [facility]) Patient is here on annual visit possibility of doing well and essentially remains asymptomatic.  Is maintaining sinus rhythm.  2. Primary hypertension Blood pressure Controlled since we switched him to Benicar HCT, he is tolerating this.  Renal function has remained stable since 2019 with stage IIIa chronic kidney disease.  3. Sinus node dysfunction (HCC) He has asymptomatic sinus bradycardia.  He remains asymptomatic with regard to this.  There is no indication for pacemaker.  He is also maintaining sinus rhythm.  Will continue to monitor this on a annual basis.  He is not on a beta-blocker.  4. OSA on CPAP He has been very compliant with his CPAP.  5. Stage 3a chronic kidney disease ([place]) Reviewed his external labs, chronic kidney disease has remained stable at stage IIIa.  Reviewed this with the patient.  Overall he is doing well, stable from cardiac standpoint, I  will see him back in a year or sooner if problems.  He has very prominent right carotid bruit, he has had carotid artery duplex in the past there is just a year ago in December 2022 which did not reveal any significant carotid disease except for mild plaque.  He is already on a statin, continue the same.  No need for aspirin as he is on anticoagulation with Xarelto.    Adrian Prows, MD, [facility] 03/24/2022, 10:47 AM Office: [phone] Pager: [phone]

## 2022-04-15 DIAGNOSIS — K080 Exfoliation of teeth due to systemic causes: Secondary | ICD-10-CM | POA: Diagnosis not present

## 2022-05-05 ENCOUNTER — Other Ambulatory Visit: Payer: Self-pay | Admitting: Cardiology

## 2022-05-05 DIAGNOSIS — I480 Paroxysmal atrial fibrillation: Secondary | ICD-10-CM

## 2022-05-05 DIAGNOSIS — K080 Exfoliation of teeth due to systemic causes: Secondary | ICD-10-CM | POA: Diagnosis not present

## 2022-05-22 DIAGNOSIS — Z85828 Personal history of other malignant neoplasm of skin: Secondary | ICD-10-CM | POA: Diagnosis not present

## 2022-05-22 DIAGNOSIS — D1801 Hemangioma of skin and subcutaneous tissue: Secondary | ICD-10-CM | POA: Diagnosis not present

## 2022-05-22 DIAGNOSIS — L821 Other seborrheic keratosis: Secondary | ICD-10-CM | POA: Diagnosis not present

## 2022-05-22 DIAGNOSIS — L570 Actinic keratosis: Secondary | ICD-10-CM | POA: Diagnosis not present

## 2022-05-22 DIAGNOSIS — L814 Other melanin hyperpigmentation: Secondary | ICD-10-CM | POA: Diagnosis not present

## 2022-07-28 DIAGNOSIS — N179 Acute kidney failure, unspecified: Secondary | ICD-10-CM | POA: Diagnosis not present

## 2022-07-28 DIAGNOSIS — R001 Bradycardia, unspecified: Secondary | ICD-10-CM | POA: Diagnosis not present

## 2022-07-28 DIAGNOSIS — I480 Paroxysmal atrial fibrillation: Secondary | ICD-10-CM | POA: Diagnosis not present

## 2022-07-28 DIAGNOSIS — E785 Hyperlipidemia, unspecified: Secondary | ICD-10-CM | POA: Diagnosis not present

## 2022-07-28 DIAGNOSIS — R55 Syncope and collapse: Secondary | ICD-10-CM | POA: Diagnosis not present

## 2022-07-28 DIAGNOSIS — E875 Hyperkalemia: Secondary | ICD-10-CM | POA: Diagnosis not present

## 2022-07-28 DIAGNOSIS — E860 Dehydration: Secondary | ICD-10-CM | POA: Diagnosis not present

## 2022-07-28 DIAGNOSIS — E861 Hypovolemia: Secondary | ICD-10-CM | POA: Diagnosis not present

## 2022-07-28 DIAGNOSIS — Z79899 Other long term (current) drug therapy: Secondary | ICD-10-CM | POA: Diagnosis not present

## 2022-07-28 DIAGNOSIS — I959 Hypotension, unspecified: Secondary | ICD-10-CM | POA: Diagnosis not present

## 2022-07-28 DIAGNOSIS — G4733 Obstructive sleep apnea (adult) (pediatric): Secondary | ICD-10-CM | POA: Diagnosis not present

## 2022-07-28 DIAGNOSIS — N183 Chronic kidney disease, stage 3 unspecified: Secondary | ICD-10-CM | POA: Diagnosis not present

## 2022-07-28 DIAGNOSIS — Z7901 Long term (current) use of anticoagulants: Secondary | ICD-10-CM | POA: Diagnosis not present

## 2022-07-28 DIAGNOSIS — I129 Hypertensive chronic kidney disease with stage 1 through stage 4 chronic kidney disease, or unspecified chronic kidney disease: Secondary | ICD-10-CM | POA: Diagnosis not present

## 2022-07-28 DIAGNOSIS — R531 Weakness: Secondary | ICD-10-CM | POA: Diagnosis not present

## 2022-07-28 DIAGNOSIS — N289 Disorder of kidney and ureter, unspecified: Secondary | ICD-10-CM | POA: Diagnosis not present

## 2022-07-28 HISTORY — DX: Dehydration: E86.0

## 2022-07-29 DIAGNOSIS — E860 Dehydration: Secondary | ICD-10-CM | POA: Diagnosis not present

## 2022-07-29 DIAGNOSIS — I081 Rheumatic disorders of both mitral and tricuspid valves: Secondary | ICD-10-CM | POA: Diagnosis not present

## 2022-07-29 DIAGNOSIS — R55 Syncope and collapse: Secondary | ICD-10-CM | POA: Diagnosis not present

## 2022-07-29 DIAGNOSIS — N179 Acute kidney failure, unspecified: Secondary | ICD-10-CM | POA: Diagnosis not present

## 2022-07-29 DIAGNOSIS — E861 Hypovolemia: Secondary | ICD-10-CM | POA: Diagnosis not present

## 2022-07-29 DIAGNOSIS — R001 Bradycardia, unspecified: Secondary | ICD-10-CM | POA: Diagnosis not present

## 2022-07-30 DIAGNOSIS — G4733 Obstructive sleep apnea (adult) (pediatric): Secondary | ICD-10-CM | POA: Diagnosis not present

## 2022-08-01 ENCOUNTER — Ambulatory Visit: Payer: Medicare Other | Admitting: Cardiology

## 2022-08-06 ENCOUNTER — Encounter: Payer: Self-pay | Admitting: Cardiology

## 2022-08-06 ENCOUNTER — Ambulatory Visit: Payer: Medicare Other | Admitting: Cardiology

## 2022-08-06 VITALS — BP 129/61 | HR 62 | Resp 16 | Ht 70.0 in | Wt 187.2 lb

## 2022-08-06 DIAGNOSIS — T671XXD Heat syncope, subsequent encounter: Secondary | ICD-10-CM | POA: Diagnosis not present

## 2022-08-06 DIAGNOSIS — R001 Bradycardia, unspecified: Secondary | ICD-10-CM | POA: Diagnosis not present

## 2022-08-06 DIAGNOSIS — I480 Paroxysmal atrial fibrillation: Secondary | ICD-10-CM

## 2022-08-06 NOTE — Progress Notes (Unsigned)
Primary Physician/Referring:  Creola Corn, MD  Patient ID: Matthew Cannon, male    DOB: 1940-06-23, 82 y.o.   MRN: [id number]  Chief Complaint  Patient presents with   Paroxysmal atrial fibrillation (HCC     HPI: Matthew Cannon  is a 82 y.o. male  Caucasian male with paroxysmal atrial fibrillation, obstructive sleep apnea and compliant with CPAP and follows Dr. Vickey Huger, has been on Xarelto since 2016, last documented A. Fib on 01/28/2018.  He is tolerating anticoagulation without any bleeding diathesis.  Patient was admitted to [facility] in [place], [place] on 07/30/2022 and discharged next day, with syncope.  On further questioning, it is a heart weekend, he was dehydrated, was not discharged with exercise program that he had syncope.  Upon admission, he was found to be in acute renal failure and also asymptomatic bradycardia, he was hydrated with IV stable and echocardiogram revealing normal LVEF, negative for cardiac injury, discharged home following day after 6-minute walk test.  Appropriate heart rate response to exercise and I recommended cardiac follow-up and consideration for event monitor.  Patient has returned to full activity and remains asymptomatic.  Past Medical History:  Diagnosis Date   Cancer (HCC)    Melanoma- forehead and cheek - left side of face   Left atrial dilatation    Sinus bradycardia    Syncope    Tachycardia-bradycardia syndrome (HCC) 08/09/2018    Past Surgical History:  Procedure Laterality Date   ADENOIDECTOMY     COLONOSCOPY     COLONOSCOPY W/ POLYPECTOMY     DISTAL BICEPS TENDON REPAIR Right 05/18/2017   Procedure: DISTAL BICEPS TENDON REPAIR;  Surgeon: Bradly Bienenstock, MD;  Location[facility];  Service: Orthopedics;  Laterality: Right;   FOOT SURGERY Left    had bone spur   INGUINAL HERNIA REPAIR Bilateral 12/15/2012   Procedure: LAPAROSCOPIC BILATERAL INGUINAL HERNIA LEFT DIRECT AND INDIRECT HERNIA  AND RIGHT DIRECT HERNIA WITH MESH;   Surgeon: Atilano Ina, MD;  Location: [facility];  Service: General;  Laterality: Bilateral;   INSERTION OF MESH Bilateral 12/15/2012   Procedure: INSERTION OF MESH BILATERAL;  Surgeon: Atilano Ina, MD;  Location: [facility];  Service: General;  Laterality: Bilateral;  INGUINAL   melanoma removal  2016   forehead, left face cheek   TONSILLECTOMY     VASECTOMY     Social History   Tobacco Use   Smoking status: Former    Packs/day: 1.00    Years: 23.00    Additional pack years: 0.00    Total pack years: 23.00    Types: Cigarettes    Quit date: 03/06/1981    Years since quitting: 41.4   Smokeless tobacco: Never  Substance Use Topics   Alcohol use: Yes    Alcohol/week: 4.0 standard drinks of alcohol    Types: 1 Standard drinks or equivalent, 3 Glasses of wine per week    Comment: 3 x week, 1-2 wine or a beer, on Friday nights a Gin Martini   Marital Status: Married   Review of Systems  Cardiovascular:  Negative for chest pain, dyspnea on exertion and leg swelling.  Gastrointestinal:  Negative for melena.   Objective      08/06/2022    3:46 PM 08/06/2022    3:43 PM 03/24/2022   10:17 AM  Vitals with BMI  Height  5\' 10"  5\' 10"   Weight  187 lbs 3 oz 183 lbs  BMI  26.86 26.26  Systolic 129 158  120  Diastolic 61 77 68  Pulse 62 63 53    Physical Exam Neck:     Vascular: Carotid bruit (bilateral R>L) present. No JVD.  Cardiovascular:     Rate and Rhythm: Regular rhythm. Bradycardia present.     Pulses: Intact distal pulses.     Heart sounds: Normal heart sounds. No murmur heard.    No gallop.  Pulmonary:     Effort: Pulmonary effort is normal.     Breath sounds: Normal breath sounds.  Abdominal:     General: Bowel sounds are normal.     Palpations: Abdomen is soft.  Musculoskeletal:        General: No swelling.    Radiology: No results found.  Laboratory examination:      Latest Ref Rng & Units 03/08/2021    8:45 AM 01/28/2018    7:31 PM 05/13/2017   11:49 AM  BMP   Glucose 70 - 99 mg/dL 161  096  95   BUN 8 - 27 mg/dL 24  23  19    Creatinine 0.76 - 1.27 mg/dL 0.45  4.09  8.11   BUN/Creat Ratio 10 - 24 20     Sodium 134 - 144 mmol/L 140  140  139   Potassium 3.5 - 5.2 mmol/L 4.6  4.3  4.4   Chloride 96 - 106 mmol/L 102  105  105   CO2 20 - 29 mmol/L 24  28  22    Calcium 8.6 - 10.2 mg/dL 9.1  9.1  9.4      External labs:  Labs 11/18/2021:  Hb 14.1/HCT 41.4, platelets 222, normal indicis.  A1c 5.6%.  TSH normal at 1.43.  Serum glucose 99 mg, BUN 29, creatinine 1.4, EGFR 48 mL, potassium 5.1, LFTs normal.  Total cholesterol 131, triglycerides 55, HDL 56, LDL 64.  Cardiac Studies:   Echocardiogram 03/20/2015: Left ventricle cavity is normal in size. Mild concentric hypertrophy of the left ventricle. Normal global wall motion. Doppler evidence of grade I (impaired) diastolic dysfunction. Calculated EF 55%. Left atrial cavity is moderately dilated at 4.8 cm. Mild prolapse of the mitral valve leaflets. No significant myxomatous changes noted. Mild mitral regurgitation. Mild tricuspid regurgitation. No evidence of pulmonary hypertension. Mild pulmonic regurgitation.  Treadmill exercise stress test 03/19/2015: Indication: Syncope The patient exercised according to Bruce Protocol, Total time recorded  9:09 min achieving max heart rate of   148which was  102% of MPHR for age and  10.16 METS of work. Resting ECG showing NSR.  Normal BP response. There was no ST-T changes of ischemia with exercise stress test. Stress terminated due to  [facility] met. and mild dyspnea.  No arrhythmias. Excellent effort.  Carotid artery duplex 01/08/2021: Duplex suggests stenosis in the right internal carotid artery (minimal). Duplex suggests stenosis in the right external carotid artery (>50%). Duplex suggests stenosis in the left internal carotid artery (minimal). Duplex suggests stenosis in the left external carotid artery (<50%). There is mild heterogenous plaque noted  in bilateral carotid arteries. Antegrade right vertebral artery flow. Antegrade left vertebral artery flow. No significant change from 10/29/2017. Follow up is appropriate if clinically indicated.  Echocardiogram 07/29/2022 [facility]):  Preserved biventricular function.  LVEF is in the 60-65% range.  2.  Mild mitral valve prolapse with mild MR.  3.  No prior echo reports available for comparison   EKG:   EKG 07/28/2022: Sinus bradycardia at rate of 51 bpm otherwise normal EKG.    EKG 03/24/2022: Normal sinus  rhythm with rate of 52 bpm, normal axis.  Incomplete right bundle branch block.  No evidence of ischemia.  Normal EKG compared compared to 03/15/2021, no significant change.   Allergies & Medications   Allergies  Allergen Reactions   Losartan Other (See Comments)    Dizziness and fatigue   Zithromax [Azithromycin] Diarrhea    Current Outpatient Medications:    atorvastatin (LIPITOR) 10 MG tablet, TAKE 1 TABLET(10 MG) BY MOUTH DAILY, Disp: 90 tablet, Rfl: 1   olmesartan-hydrochlorothiazide (BENICAR HCT) 20-12.5 MG tablet, TAKE 1 TABLET BY MOUTH EVERY MORNING, Disp: 90 tablet, Rfl: 3   XARELTO 15 MG TABS tablet, TAKE 1 TABLET(15 MG) BY MOUTH DAILY WITH SUPPER, Disp: 90 tablet, Rfl: 3   Assessment     ICD-10-CM   1. Paroxysmal atrial fibrillation (HCC)  I48.0     2. Sinus bradycardia  R00.1     3. Heat syncope, subsequent encounter  T67.1XXD       CHA2DS2-VASc Score is 4.  Yearly risk of stroke: 4.8% (A, HTN, carotid atherosclerosis).  Score of 1=0.6; 2=2.2; 3=3.2; 4=4.8; 5=7.2; 6=9.8; 7=>9.8) -(CHF; HTN; vasc disease DM,  Male = 1; Age <65 =0; 65-74 = 1,  >75 =2; stroke/embolism= 2).    Recommendations:   MERGIM ROTHWELL is a 82 y.o. Caucasian male with paroxysmal atrial fibrillation, obstructive sleep apnea and compliant with CPAP and follows Dr. Vickey Huger, has been on Xarelto since 2016, last documented A. Fib on 01/28/2018.  He is tolerating  anticoagulation without any bleeding diathesis.  Patient was admitted to [facility] in [place], [place] on 07/30/2022 and discharged next day, with syncope.  1. Paroxysmal atrial fibrillation [facility]) Patient continues to maintain sinus rhythm.  During hospitalization, he had no high degree AV block, no atrial fibrillation, remained asymptomatic.  Renal function has returned back to normal and he was discharged home the next day after hydration.  2. Sinus bradycardia Patient has asymptomatic sinus bradycardia, however this is probably related to patient being extremely active and continues to exercise on a regular basis.  3. Heat syncope, subsequent encounter I reviewed his chart from the hospitalization, do not think he needs event monitoring for now.  If he has recurrence, could consider loop recorder implantation in view of underlying asymptomatic sinus bradycardia.  I suspect his syncope from dehydration.  I will see him back in 6 months for follow-up.  Renal function has returned back to baseline.  External labs reviewed.    Yates Decamp, MD, [facility] 08/07/2022, 10:43 PM Office: [phone] Pager: [phone]

## 2022-08-29 DIAGNOSIS — G4733 Obstructive sleep apnea (adult) (pediatric): Secondary | ICD-10-CM | POA: Diagnosis not present

## 2022-09-29 DIAGNOSIS — G4733 Obstructive sleep apnea (adult) (pediatric): Secondary | ICD-10-CM | POA: Diagnosis not present

## 2022-10-26 ENCOUNTER — Other Ambulatory Visit: Payer: Self-pay | Admitting: Cardiology

## 2022-10-26 DIAGNOSIS — I6523 Occlusion and stenosis of bilateral carotid arteries: Secondary | ICD-10-CM

## 2022-10-26 DIAGNOSIS — E785 Hyperlipidemia, unspecified: Secondary | ICD-10-CM

## 2022-11-05 NOTE — Patient Instructions (Signed)

## 2022-11-05 NOTE — Progress Notes (Signed)
PATIENT: Matthew Cannon DOB: 13-Feb-1940  REASON FOR VISIT: follow up HISTORY FROM: patient  Chief Complaint  Patient presents with   Room 1    Pt is here Alone. Pt states that things are going "real fine" with his CPAP Machine. Pt states that he doesn't have any new questions or concerns to discuss today.     HISTORY OF PRESENT ILLNESS:  11/10/22 ALL: [name] returns for follow up for OSA on CPAP. He is doing well on therapy. He is using CPAP nightly for about 7-8 hours, on average. He denies concerns with machine or supplies.     11/11/2021 ALL: [name] returns for follow up for OSA on CPAP. He continues to do well on therapy. He is using CPAP nightly for about 7 hours. He is using a FFM. No concerns with machine or supplies.     03/12/21 ALL: Matthew Cannon is a 82 y.o. male here today for follow up for OSA on CPAP. He recently received a new CPAP machine and returns for initial compliance report. He is doing very well. He is using his new machine nightly for at least 4 hours. He feels he may be sleeping better than with his old machine.        11/05/2020 ALL: Matthew Cannon returns for follow up for OSA on CPAP. He continues to do well. Orders for travel machine placed in 2022-05-12but he did not pursue. He was able to use his home CPAP with travel. He continues to do very well on CPAP. He is sleeping "great". He denies concerns with machine or supplies. His machine was set up 06/2015.     11/03/2019 ALL:  Matthew Cannon is a 82 y.o. male here today for follow up for OSA on CPAP.  He is doing very well on CPAP therapy.  He does admit that it is not comfortable, however, he does note improvement in sleep quality when he is using CPAP therapy.  He denies any concerns with CPAP machine or supplies.  Compliance report dated 10/03/2019 through 11/01/2019 reveals that he used CPAP 30 of the past 30 days for compliance of 100%.  He used CPAP greater than 4 hours all 30 days.   Average usage was 7 hours and 21 minutes.  Residual AHI was 2.5 on 5 to 12 cm of water and an EPR of 3.  There was no significant leak noted.  HISTORY: (copied from Dr Dohmeier's note on 11/01/2018)   HPI:  Matthew Cannon is a 82 y.o. male , seen here as a referral from Dr. Jacinto Halim for a sleep evaluation, to rule out in the setting of atrial fibrillation. His AHI in 2017 was 37.6/h . December 2019 he had break through atrial fib. Liver impairment has forced a reduction in xeralto dose.    Rv 11-01-2018- [name] reports not feeling fatigued or depressed, using his CPAO compliantly.  He is married, and he and his spouse have limited their social activities, shopping and not going out much. They communicate by face time.  His eldest daughter passed away in 06-15-22 from Breast cancer at age 41.   Mr. Matthew Cannon has been 100% compliant CPAP user with an average user time of 7 hours and 2 minutes.  Minimum pressure is 5 and maximum pressure setting 12 cmH2O with an EPR of 3 cmH2O.  His air leak is very low, his residual events per hour or 2.1 which is an excellent resolution.  He has mostly  obstructive apneas left but also 4 minutes per night and Cheyne-Stokes respirations.  Central apneas seem to be breaking through on the CPAP.  Fatigue severity scale was endorsed at 16 and his Epworth sleepiness score at 7, the geriatric depression score at 1 point out of 15. No changes necessary.    2-30-2017.The patient had undergone a 30 day cardiac monitor , which captured 4 atrial fib attacks, 3 in sleep. He started on 3 medications , which he doesn't tolerate well. He feels sick and clammy on the medications. He has chronic rhinitis and sinusitis.  [name] has undergone 2 Mohs surgeries, one on the left neck just below the lower jaw and one on the high forehead. This will be important should CPAP treatment be indicated after our evaluation is completed.   [name] medical records speak of a  cardiac workup following syncope spells ; there were 3 near syncopal episodes in October - November 2016 the first one occurred while he was still working in the heat outside and he may have been dehydrated or overheated, the second and third Near-syncope occurred inside without any exertion. The symptoms lasted each time between 4 and 5 minutes or even less. He had no chest pain or tightness and no shortness of breath associated Dr. Verl Dicker workup included an event monitor and echocardiogram and a cardiac stress test he continued to have intermittent episodes of dizziness.   10-29-2017, RV on CPAP.  Matthew Cannon is seen here today is a 82 year old male patient originally referred by Dr. Jacinto Halim.  He has for several months not needed any kind of beta-blocker to control his heart rate and had no syncopal episodes or near syncope's.  He also did not receive a pacemaker as his heart rate is now in the low normal range. The patient is a CPAP patient who is using an autotitrator after being diagnosed with obstructive sleep apnea he has been 100% compliant over the last 90 days and 30 days.  His 30-day download shows an average use at time of 7 hours and 38 minutes at night pressure window is between 5 and 10 cmH2O pressure with 3 cm expiratory relief.  His residual AHI is 2.6 which speaks for an excellent resolution.  He does have only minor air leakage and the 95th percentile pressure is 9.6.  I would actually like to increase his maximum pressure window by 2 cm to 12 cm water.   Sleep habits are as follows:The patient usually goes to bed between 10 and 11 PM, he is currently using one small pillow but used multiple pillows as a head rest before. He prefers the supine sleep position, the bedroom is cool, quiet and dark. He has nocturia 4-6 times each night- now 3-4 times a night , his sinus blockage makes breathing difficulties and he snores.  He drinks a lot of water. He rises at 7 AM , often already  awake after 5 AM. He doesn't wake with or from headaches and he is usually well refreshed. No alarm needed. He feels not restless, only sometimes light headed and that's not the case for month now.    Sleep medical history and family sleep history: 10-29-2017,  Atrial fib, seen by Dr. Johney Frame - has not needed a pacemaker he reports, as heart rate has not been lower than 40 bpm, yet he had several syncope episodes. Reduced Toprol dose.  In the meantime developed a masked face and hoarse voice, no tremor.  Social history: married, retired, 2 sisters, 3 adult children. Non smoker , 3 glasses of ETOH per week. His current medications include Xarelto, lisinopril he is now also on flecainide and metoprolol. The patient has a very remote smoking history of one pack per day until1983. He is a modest alcohol drinker with 3 drinks per week and does not use caffeine aged beverages.     REVIEW OF SYSTEMS: Out of a complete 14 system review of symptoms, the patient complains only of the following symptoms, none and all other reviewed systems are negative.  ESS: 8/24  ALLERGIES: Allergies  Allergen Reactions   Losartan Other (See Comments)    Dizziness and fatigue   Zithromax [Azithromycin] Diarrhea    HOME MEDICATIONS: Outpatient Medications Prior to Visit  Medication Sig Dispense Refill   atorvastatin (LIPITOR) 10 MG tablet TAKE 1 TABLET(10 MG) BY MOUTH DAILY 90 tablet 1   olmesartan-hydrochlorothiazide (BENICAR HCT) 20-12.5 MG tablet TAKE 1 TABLET BY MOUTH EVERY MORNING 90 tablet 3   XARELTO 15 MG TABS tablet TAKE 1 TABLET(15 MG) BY MOUTH DAILY WITH SUPPER 90 tablet 3   No facility-administered medications prior to visit.    PAST MEDICAL HISTORY: Past Medical History:  Diagnosis Date   Cancer (HCC)    Melanoma- forehead and cheek - left side of face   Dehydration 07/28/2022   Left atrial dilatation    Sinus bradycardia    Syncope    Tachycardia-bradycardia syndrome (HCC) 08/09/2018     PAST SURGICAL HISTORY: Past Surgical History:  Procedure Laterality Date   ADENOIDECTOMY     COLONOSCOPY     COLONOSCOPY W/ POLYPECTOMY     DISTAL BICEPS TENDON REPAIR Right 05/18/2017   Procedure: DISTAL BICEPS TENDON REPAIR;  Surgeon: Bradly Bienenstock, MD;  Location: [facility] OR;  Service: Orthopedics;  Laterality: Right;   FOOT SURGERY Left    had bone spur   INGUINAL HERNIA REPAIR Bilateral 12/15/2012   Procedure: LAPAROSCOPIC BILATERAL INGUINAL HERNIA LEFT DIRECT AND INDIRECT HERNIA  AND RIGHT DIRECT HERNIA WITH MESH;  Surgeon: Atilano Ina, MD;  Location: [facility];  Service: General;  Laterality: Bilateral;   INSERTION OF MESH Bilateral 12/15/2012   Procedure: INSERTION OF MESH BILATERAL;  Surgeon: Atilano Ina, MD;  Location: [facility];  Service: General;  Laterality: Bilateral;  INGUINAL   melanoma removal  2016   forehead, left face cheek   TONSILLECTOMY     VASECTOMY      FAMILY HISTORY: Family History  Problem Relation Age of Onset   Suicidality Father    Alcohol abuse Father    Cancer Daughter        breast    SOCIAL HISTORY: Social History   Socioeconomic History   Marital status: Married    Spouse name: Not on file   Number of children: 3   Years of education: Not on file   Highest education level: Not on file  Occupational History   Not on file  Tobacco Use   Smoking status: Former    Current packs/day: 0.00    Average packs/day: 1 pack/day for 23.0 years (23.0 ttl pk-yrs)    Types: Cigarettes    Start date: 03/06/1958    Quit date: 03/06/1981    Years since quitting: 41.7   Smokeless tobacco: Never  Vaping Use   Vaping status: Never Used  Substance and Sexual Activity   Alcohol use: Yes    Alcohol/week: 4.0 standard drinks of alcohol    Types: 1 Standard  drinks or equivalent, 3 Glasses of wine per week    Comment: 3 x week, 1-2 wine or a beer, on Friday nights a Gin Martini   Drug use: No   Sexual activity: Not on file  Other Topics Concern   Not on  file  Social History Narrative   Lives in [place] with spouse.  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 [employer] Strain: Not on file  Food Insecurity: No Food Insecurity (07/28/2022)   [facility]   Hunger Vital Sign    Worried About Running Out of Food in the Last Year: Never true    Ran Out of Food in the Last Year: Never true  Transportation Needs: No Transportation Needs (07/28/2022)   Received from [facility], [facility]   PRAPARE - Transportation    Lack of Transportation (Medical): No    Lack of Transportation (Non-Medical): No  Physical Activity: Not on file  Stress: No Stress Concern Present (07/28/2022)   Received from [facility] - Occupational Stress Questionnaire    Feeling of Stress : Not at all  Social Connections: Unknown (07/28/2022)   Received from [facility], [facility]   Social Network    Social Network: Not on file  Intimate Partner Violence: Not At Risk (07/28/2022)   Received from [facility], [facility]   HITS    Over the last 12 months how often did your partner physically hurt you?: 1    Over the last 12 months how often did your partner insult you or talk down to you?: 1    Over the last 12 months how often did your partner threaten you with physical harm?: 1    Over the last 12 months how often did your partner scream or curse at you?: 1      PHYSICAL EXAM  Vitals:   11/10/22 0902  BP: 133/68  Pulse: (!) 51  Weight: 187 lb 8 oz (85 kg)  Height: 5\' 10"  (1.778 m)      Body mass index is 26.9 kg/m.  Generalized: Well developed, in no acute distress  Cardiology: normal rate and rhythm, no murmur noted Respiratory: clear to auscultation bilaterally  Neurological examination  Mentation: Alert oriented to time, place, history taking. Follows all commands speech and language fluent Cranial nerve II-XII:  Pupils were equal round reactive to light. Extraocular movements were full, visual field were full  Motor: The motor testing reveals 5 over 5 strength of all 4 extremities. Good symmetric motor tone is noted throughout.  Gait and station: Gait is normal.    DIAGNOSTIC DATA (LABS, IMAGING, TESTING) - I reviewed patient records, labs, notes, testing and imaging myself where available.      No data to display           Lab Results  Component Value Date   WBC 9.9 01/28/2018   HGB 15.8 01/28/2018   HCT 48.2 01/28/2018   MCV 93.1 01/28/2018   PLT 268 01/28/2018      Component Value Date/Time   NA 140 03/08/2021 0845   K 4.6 03/08/2021 0845   CL 102 03/08/2021 0845   CO2 24 03/08/2021 0845   GLUCOSE 105 (H) 03/08/2021 0845   GLUCOSE 103 (H) 01/28/2018 1931   BUN 24 03/08/2021 0845   CREATININE 1.23 03/08/2021 0845   CALCIUM 9.1 03/08/2021 0845   PROT 7.4 03/08/2021 0845  ALBUMIN 4.7 (H) 03/08/2021 0845   AST 23 03/08/2021 0845   ALT 19 03/08/2021 0845   ALKPHOS 76 03/08/2021 0845   BILITOT 0.7 03/08/2021 0845   GFRNONAA 44 (L) 01/28/2018 1931   GFRAA 51 (L) 01/28/2018 1931   Lab Results  Component Value Date   CHOL 146 03/08/2021   HDL 52 03/08/2021   LDLCALC 76 03/08/2021   TRIG 99 03/08/2021   No results found for: "HGBA1C" No results found for: "VITAMINB12" No results found for: "TSH"    ASSESSMENT AND PLAN 82 y.o. year old male  has a past medical history of Cancer (HCC), Dehydration (07/28/2022), Left atrial dilatation, Sinus bradycardia, Syncope, and Tachycardia-bradycardia syndrome (HCC) (08/09/2018). here with     ICD-10-CM   1. OSA on CPAP  G47.33 For home use only DME continuous positive airway pressure (CPAP)       Rosetta is doing very well on CPAP therapy. Compliance report reveals excellent compliance. He was encouraged to continue using CPAP nightly and for greater than 4 hours each night. Healthy lifestyle habits encouraged.  He will follow-up  closely with primary care.  He will follow-up with [place] in 1 year. He verbalizes understanding and agreement with this plan.   Orders Placed This Encounter  Procedures   For home use only DME continuous positive airway pressure (CPAP)    Supplies    Order Specific Question:   Length of Need    Answer:   Lifetime    Order Specific Question:   Patient has OSA or probable OSA    Answer:   Yes    Order Specific Question:   Is the patient currently using CPAP in the home    Answer:   Yes    Order Specific Question:   Settings    Answer:   Other see comments    Order Specific Question:   CPAP supplies needed    Answer:   Mask, headgear, cushions, filters, heated tubing and water chamber      No orders of the defined types were placed in this encounter.      Shawnie Dapper, FNP-C 11/10/2022, 9:34 AM [facility] [address], [address] [place], [place] [address] [phone]

## 2022-11-08 DIAGNOSIS — Z23 Encounter for immunization: Secondary | ICD-10-CM | POA: Diagnosis not present

## 2022-11-10 ENCOUNTER — Encounter: Payer: Self-pay | Admitting: Family Medicine

## 2022-11-10 ENCOUNTER — Ambulatory Visit: Payer: Medicare Other | Admitting: Family Medicine

## 2022-11-10 VITALS — BP 133/68 | HR 51 | Ht 70.0 in | Wt 187.5 lb

## 2022-11-10 DIAGNOSIS — G4733 Obstructive sleep apnea (adult) (pediatric): Secondary | ICD-10-CM

## 2022-11-24 DIAGNOSIS — I10 Essential (primary) hypertension: Secondary | ICD-10-CM | POA: Diagnosis not present

## 2022-11-24 DIAGNOSIS — R7301 Impaired fasting glucose: Secondary | ICD-10-CM | POA: Diagnosis not present

## 2022-11-24 DIAGNOSIS — E785 Hyperlipidemia, unspecified: Secondary | ICD-10-CM | POA: Diagnosis not present

## 2022-11-24 DIAGNOSIS — Z0001 Encounter for general adult medical examination with abnormal findings: Secondary | ICD-10-CM | POA: Diagnosis not present

## 2022-11-25 DIAGNOSIS — C44519 Basal cell carcinoma of skin of other part of trunk: Secondary | ICD-10-CM | POA: Diagnosis not present

## 2022-11-25 DIAGNOSIS — D225 Melanocytic nevi of trunk: Secondary | ICD-10-CM | POA: Diagnosis not present

## 2022-11-25 DIAGNOSIS — L821 Other seborrheic keratosis: Secondary | ICD-10-CM | POA: Diagnosis not present

## 2022-11-25 DIAGNOSIS — K080 Exfoliation of teeth due to systemic causes: Secondary | ICD-10-CM | POA: Diagnosis not present

## 2022-11-25 DIAGNOSIS — L570 Actinic keratosis: Secondary | ICD-10-CM | POA: Diagnosis not present

## 2022-11-25 DIAGNOSIS — Z85828 Personal history of other malignant neoplasm of skin: Secondary | ICD-10-CM | POA: Diagnosis not present

## 2022-11-25 DIAGNOSIS — L814 Other melanin hyperpigmentation: Secondary | ICD-10-CM | POA: Diagnosis not present

## 2022-12-01 DIAGNOSIS — R82998 Other abnormal findings in urine: Secondary | ICD-10-CM | POA: Diagnosis not present

## 2022-12-01 DIAGNOSIS — R001 Bradycardia, unspecified: Secondary | ICD-10-CM | POA: Diagnosis not present

## 2022-12-01 DIAGNOSIS — Z0000 Encounter for general adult medical examination without abnormal findings: Secondary | ICD-10-CM | POA: Diagnosis not present

## 2022-12-01 DIAGNOSIS — I480 Paroxysmal atrial fibrillation: Secondary | ICD-10-CM | POA: Diagnosis not present

## 2022-12-01 DIAGNOSIS — E785 Hyperlipidemia, unspecified: Secondary | ICD-10-CM | POA: Diagnosis not present

## 2022-12-01 DIAGNOSIS — K080 Exfoliation of teeth due to systemic causes: Secondary | ICD-10-CM | POA: Diagnosis not present

## 2022-12-01 DIAGNOSIS — R0989 Other specified symptoms and signs involving the circulatory and respiratory systems: Secondary | ICD-10-CM | POA: Diagnosis not present

## 2022-12-01 DIAGNOSIS — C44519 Basal cell carcinoma of skin of other part of trunk: Secondary | ICD-10-CM | POA: Diagnosis not present

## 2022-12-25 DIAGNOSIS — I129 Hypertensive chronic kidney disease with stage 1 through stage 4 chronic kidney disease, or unspecified chronic kidney disease: Secondary | ICD-10-CM | POA: Diagnosis not present

## 2022-12-25 DIAGNOSIS — I700 Atherosclerosis of aorta: Secondary | ICD-10-CM | POA: Diagnosis not present

## 2023-01-02 DIAGNOSIS — I129 Hypertensive chronic kidney disease with stage 1 through stage 4 chronic kidney disease, or unspecified chronic kidney disease: Secondary | ICD-10-CM | POA: Diagnosis not present

## 2023-01-13 ENCOUNTER — Telehealth: Payer: Self-pay | Admitting: Cardiology

## 2023-01-13 NOTE — Telephone Encounter (Signed)
  Patient sent a mychart message to scheduling pool stating he was responding to the missed call from [place]. Please call patient back.

## 2023-01-13 NOTE — Telephone Encounter (Signed)
  Per MyChart scheduling message:   Pt c/o BP issue: STAT if pt c/o blurred vision, one-sided weakness or slurred speech  1. What are your last 5 BP readings?   2. Are you having any other symptoms (ex. Dizziness, headache, blurred vision, passed out)?   3. What is your BP issue?     128/5/57, 156/75/51, 147/77/58, 148/77/58, 162/76/47 Having no other symptoms, normal activity  BP Issue. Annual physical 10-28. Blood work showed kidney concerns. Changed meds. Stopped Olmesartan 5 mg. Added Olmesartan Medoxomil 40 mg. Had second blood work . Showed high levels potassium. Reported BP reading to [place]. Redid blood work. Results showed some improvement in potassium. After reporting BP readings to Dr. Timothy Lasso, prescribed Amlodipine Besylate2.5 mg. Have taken for one week. Still little high. Thought second opinion to verify was wise.

## 2023-01-13 NOTE — Telephone Encounter (Signed)
I spoke with patient.  He reports blood pressure readings below are his most recent readings.  He is currently taking amlodipine 2.5 mg daily and Olmesartan Medoxomil 40 mg daily prescribed by his PCP.  Patient concerned BP is still running high and would like [name] recommendations.

## 2023-01-13 NOTE — Telephone Encounter (Signed)
If he does not mind taking TID medications, Hydralazine 50 mg TID 1 month supply and 6 refills and to go ahead and make appointment with me in 2-3 months

## 2023-01-13 NOTE — Telephone Encounter (Signed)
Left message to call office

## 2023-01-14 NOTE — Telephone Encounter (Signed)
Spoke with the patient and gave him recommendations from Dr. Jacinto Halim. Patient states that he will think about it and call us back.

## 2023-01-21 DIAGNOSIS — I10 Essential (primary) hypertension: Secondary | ICD-10-CM | POA: Diagnosis not present

## 2023-01-22 ENCOUNTER — Other Ambulatory Visit: Payer: Self-pay | Admitting: Internal Medicine

## 2023-01-22 DIAGNOSIS — R7989 Other specified abnormal findings of blood chemistry: Secondary | ICD-10-CM

## 2023-01-23 ENCOUNTER — Ambulatory Visit
Admission: RE | Admit: 2023-01-23 | Discharge: 2023-01-23 | Disposition: A | Discharge status: Home / Self Care | Payer: Medicare Other | Source: Ambulatory Visit | Attending: Internal Medicine | Admitting: Internal Medicine

## 2023-01-23 DIAGNOSIS — R7989 Other specified abnormal findings of blood chemistry: Secondary | ICD-10-CM | POA: Diagnosis not present

## 2023-02-20 DIAGNOSIS — G43B0 Ophthalmoplegic migraine, not intractable: Secondary | ICD-10-CM | POA: Diagnosis not present

## 2023-02-20 DIAGNOSIS — H35033 Hypertensive retinopathy, bilateral: Secondary | ICD-10-CM | POA: Diagnosis not present

## 2023-02-20 DIAGNOSIS — H524 Presbyopia: Secondary | ICD-10-CM | POA: Diagnosis not present

## 2023-02-20 DIAGNOSIS — H43813 Vitreous degeneration, bilateral: Secondary | ICD-10-CM | POA: Diagnosis not present

## 2023-02-20 DIAGNOSIS — H25813 Combined forms of age-related cataract, bilateral: Secondary | ICD-10-CM | POA: Diagnosis not present

## 2023-03-25 ENCOUNTER — Ambulatory Visit: Payer: Medicare Other | Admitting: Cardiology

## 2023-03-25 NOTE — Progress Notes (Deleted)
 Cardiology Office Note:  .   Date:  03/25/2023  ID:  Matthew Cannon, DOB 1940/07/09, MRN [id number] PCP: Creola Corn, MD  [facility] Providers Cardiologist:  None { Click to update primary MD,subspecialty MD or APP then REFRESH:1}  History of Present Illness: [name] Kitchen   Matthew Cannon is a 83 y.o. Caucasian male with paroxysmal atrial fibrillation, obstructive sleep apnea and compliant with CPAP and follows Dr. Vickey Huger, has been on Xarelto since 2016, last documented A. Fib on 01/28/2018.  He is tolerating anticoagulation without any bleeding diathesis.  He has baseline sinus bradycardia and is asymptomatic that is chronic.   Patient was admitted to [facility] in [place], [place] on 07/30/2022 and discharged next day, with syncope felt to be due to dehydration normal LVEF.    Discussed the use of AI scribe software for clinical note transcription with the patient, who gave verbal consent to proceed.  History of Present Illness            Labs   Lab Results  Component Value Date   CHOL 146 03/08/2021   HDL 52 03/08/2021   LDLCALC 76 03/08/2021   TRIG 99 03/08/2021   Lab Results  Component Value Date   NA 140 03/08/2021   K 4.6 03/08/2021   CO2 24 03/08/2021   GLUCOSE 105 (H) 03/08/2021   BUN 24 03/08/2021   CREATININE 1.23 03/08/2021   CALCIUM 9.1 03/08/2021   EGFR 59 (L) 03/08/2021   GFRNONAA 44 (L) 01/28/2018      Latest Ref Rng & Units 03/08/2021    8:45 AM 01/28/2018    7:31 PM 05/13/2017   11:49 AM  BMP  Glucose 70 - 99 mg/dL 409  811  95   BUN 8 - 27 mg/dL 24  23  19    Creatinine 0.76 - 1.27 mg/dL 9.14  7.82  9.56   BUN/Creat Ratio 10 - 24 20     Sodium 134 - 144 mmol/L 140  140  139   Potassium 3.5 - 5.2 mmol/L 4.6  4.3  4.4   Chloride 96 - 106 mmol/L 102  105  105   CO2 20 - 29 mmol/L 24  28  22    Calcium 8.6 - 10.2 mg/dL 9.1  9.1  9.4       Latest Ref Rng & Units 01/28/2018    7:31 PM 05/13/2017   11:49 AM 12/10/2012    8:35 AM   CBC  WBC 4.0 - 10.5 K/uL 9.9  8.3  7.1   Hemoglobin 13.0 - 17.0 g/dL 21.3  08.6  57.8   Hematocrit 39.0 - 52.0 % 48.2  46.3  47.2   Platelets 150 - 400 K/uL 268  237  268    External Labs:  Labs 01/30/2023 from PCPs office or Care Everywhere:  A1c 5.8%.  TSH normal at 2.480.  Hb 14.3/HCT 44.5, platelets 264, normal indicis.  Total cholesterol 140, triglycerides 74, HDL 64, LDL 61.  Serum glucose 101 mg, BUN 34, creatinine 1.67, EGFR 40 mL, potassium 5.2, LFTs normal.  ***ROS Physical Exam:   VS:  There were no vitals taken for this visit.   Wt Readings from Last 3 Encounters:  11/10/22 187 lb 8 oz (85 kg)  08/06/22 187 lb 3.2 oz (84.9 kg)  03/24/22 183 lb (83 kg)     ***Physical Exam Studies Reviewed: .    *** EKG:         ***  Medications and allergies  Allergies  Allergen Reactions   Losartan Other (See Comments)    Dizziness and fatigue   Zithromax [Azithromycin] Diarrhea     Current Outpatient Medications:    atorvastatin (LIPITOR) 10 MG tablet, TAKE 1 TABLET(10 MG) BY MOUTH DAILY, Disp: 90 tablet, Rfl: 1   olmesartan-hydrochlorothiazide (BENICAR HCT) 20-12.5 MG tablet, TAKE 1 TABLET BY MOUTH EVERY MORNING, Disp: 90 tablet, Rfl: 3   XARELTO 15 MG TABS tablet, TAKE 1 TABLET(15 MG) BY MOUTH DAILY WITH SUPPER, Disp: 90 tablet, Rfl: 3   ASSESSMENT AND PLAN: .      ICD-10-CM   1. Paroxysmal atrial fibrillation (HCC)  I48.0     2. Sinus bradycardia  R00.1       1. Paroxysmal atrial fibrillation (HCC) ***  2. Sinus bradycardia ***  Assessment and Plan                Signed,  Yates Decamp, MD, [facility] 03/25/2023, 9:01 AM [facility] [address] [place], [place] [address] Phone: [phone]. Fax:  [phone]

## 2023-03-26 ENCOUNTER — Encounter: Payer: Self-pay | Admitting: Cardiology

## 2023-03-30 ENCOUNTER — Encounter: Payer: Self-pay | Admitting: Cardiology

## 2023-03-30 ENCOUNTER — Ambulatory Visit: Payer: Medicare Other | Attending: Cardiology | Admitting: Cardiology

## 2023-03-30 VITALS — BP 128/68 | HR 56 | Resp 16 | Ht 70.0 in | Wt 190.2 lb

## 2023-03-30 DIAGNOSIS — I10 Essential (primary) hypertension: Secondary | ICD-10-CM

## 2023-03-30 DIAGNOSIS — I480 Paroxysmal atrial fibrillation: Secondary | ICD-10-CM | POA: Diagnosis not present

## 2023-03-30 DIAGNOSIS — R001 Bradycardia, unspecified: Secondary | ICD-10-CM

## 2023-03-30 NOTE — Progress Notes (Signed)
 Cardiology Office Note:  .   Date:  03/30/2023  ID:  Matthew Cannon, DOB 03/18/1940, MRN [id number] PCP: Creola Corn, MD  [facility] Providers Cardiologist:  Yates Decamp, MD   History of Present Illness: [name] Kitchen   Matthew Cannon is a 83 y.o. Caucasian male with paroxysmal atrial fibrillation, obstructive sleep apnea and compliant with CPAP and follows Dr. Vickey Huger, has been on Xarelto since 2016, last documented A. Fib on 01/28/2018.  He is tolerating anticoagulation without any bleeding diathesis.  He has baseline sinus bradycardia and is asymptomatic that is chronic.   Patient was admitted to [facility] in [place], [place] on 07/30/2022 and discharged next day, with syncope felt to be due to dehydration normal LVEF.    Discussed the use of AI scribe software for clinical note transcription with the patient, who gave verbal consent to proceed.  History of Present Illness   The patient, with a history of paroxysmal atrial fibrillation and hypertension, presents for a routine follow-up. He reports no episodes of heart racing or atrial fibrillation. His exercise capacity has been affected by the cold weather, but otherwise remains the same. The patient has been monitoring his blood pressure at home, noting occasional high readings in the morning, which decrease later in the day. He has not been checking his blood pressure daily since mid-December due to consistently good readings.  The patient also reports a recent concern with his kidney function. Labs from December showed a decrease in kidney function, which was followed up with his primary care physician. The patient reports that subsequent labs showed no further decrease in function, but no medication was started.      Labs   Lab Results  Component Value Date   CHOL 146 03/08/2021   HDL 52 03/08/2021   LDLCALC 76 03/08/2021   TRIG 99 03/08/2021   Lab Results  Component Value Date   NA 140 03/08/2021   K 4.6  03/08/2021   CO2 24 03/08/2021   GLUCOSE 105 (H) 03/08/2021   BUN 24 03/08/2021   CREATININE 1.23 03/08/2021   CALCIUM 9.1 03/08/2021   EGFR 59 (L) 03/08/2021   GFRNONAA 44 (L) 01/28/2018      Latest Ref Rng & Units 03/08/2021    8:45 AM 01/28/2018    7:31 PM 05/13/2017   11:49 AM  BMP  Glucose 70 - 99 mg/dL 409  811  95   BUN 8 - 27 mg/dL 24  23  19    Creatinine 0.76 - 1.27 mg/dL 9.14  7.82  9.56   BUN/Creat Ratio 10 - 24 20     Sodium 134 - 144 mmol/L 140  140  139   Potassium 3.5 - 5.2 mmol/L 4.6  4.3  4.4   Chloride 96 - 106 mmol/L 102  105  105   CO2 20 - 29 mmol/L 24  28  22    Calcium 8.6 - 10.2 mg/dL 9.1  9.1  9.4       Latest Ref Rng & Units 01/28/2018    7:31 PM 05/13/2017   11:49 AM 12/10/2012    8:35 AM  CBC  WBC 4.0 - 10.5 K/uL 9.9  8.3  7.1   Hemoglobin 13.0 - 17.0 g/dL 21.3  08.6  57.8   Hematocrit 39.0 - 52.0 % 48.2  46.3  47.2   Platelets 150 - 400 K/uL 268  237  268    External Labs:  Labs 01/30/2023 from PCPs office or Care Everywhere:  A1c 5.8%.  TSH normal at 2.480.  Hb 14.3/HCT 44.5, platelets 264, normal indicis.  Total cholesterol 140, triglycerides 74, HDL 64, LDL 61.  Serum glucose 101 mg, BUN 34, creatinine 1.67, EGFR 40 mL, potassium 5.2, LFTs normal.  Review of Systems  Cardiovascular:  Negative for chest pain, dyspnea on exertion and leg swelling.   Physical Exam:   VS:  BP 128/68 (BP Location: Left Arm, Patient Position: Sitting, Cuff Size: Large)   Pulse (!) 56   Resp 16   Ht 5\' 10"  (1.778 m)   Wt 190 lb 3.2 oz (86.3 kg)   SpO2 98%   BMI 27.29 kg/m    Wt Readings from Last 3 Encounters:  03/30/23 190 lb 3.2 oz (86.3 kg)  11/10/22 187 lb 8 oz (85 kg)  08/06/22 187 lb 3.2 oz (84.9 kg)    Physical Exam Neck:     Vascular: No carotid bruit or JVD.  Cardiovascular:     Rate and Rhythm: Regular rhythm. Bradycardia present.     Pulses: Intact distal pulses.     Heart sounds: Normal heart sounds. No murmur heard.    No gallop.   Pulmonary:     Effort: Pulmonary effort is normal.     Breath sounds: Normal breath sounds.  Abdominal:     General: Bowel sounds are normal.     Palpations: Abdomen is soft.  Musculoskeletal:     Right lower leg: No edema.     Left lower leg: No edema.    Studies Reviewed: .    NA EKG:    EKG Interpretation Date/Time:  Monday March 30 2023 15:58:56 EST Ventricular Rate:  49 PR Interval:  194 QRS Duration:  84 QT Interval:  418 QTC Calculation: 377 R Axis:   39  Text Interpretation: EKG 03/30/2023: Sinus bradycardia at rate of 49 bpm otherwise normal EKG.  Compared to 01/28/2018, no significant change, PACs (2) not present. Confirmed by Delrae Rend [id number]) on 03/30/2023 4:24:30 PM    Medications and allergies    Allergies  Allergen Reactions   Losartan Other (See Comments)    Dizziness and fatigue   Zithromax [Azithromycin] Diarrhea     Current Outpatient Medications:    amLODipine (NORVASC) 2.5 MG tablet, Take 2.5 mg by mouth daily., Disp: , Rfl:    atorvastatin (LIPITOR) 10 MG tablet, TAKE 1 TABLET(10 MG) BY MOUTH DAILY, Disp: 90 tablet, Rfl: 1   olmesartan (BENICAR) 40 MG tablet, Take 40 mg by mouth daily., Disp: , Rfl:    XARELTO 15 MG TABS tablet, TAKE 1 TABLET(15 MG) BY MOUTH DAILY WITH SUPPER, Disp: 90 tablet, Rfl: 3   ASSESSMENT AND PLAN: .      ICD-10-CM   1. Paroxysmal atrial fibrillation (HCC)  I48.0 EKG 12-Lead    2. Primary hypertension  I10     3. Sinus bradycardia by electrocardiogram  R00.1       Assessment and Plan    Paroxysmal Atrial Fibrillation No recent episodes, maintaining a regular rhythm and asymptomatic. Continue Rivaroxaban 15 mg once daily in the evening for stroke prevention, considering concurrent kidney disease. Follow-up in one year.  Bradycardia Heart rates range between 45-55 bpm and remain asymptomatic. No intervention is needed unless symptoms develop. Follow-up in one year.  Hypertension Well-controlled with  Olmesartan 40 mg daily and Amlodipine 2.5 mg daily. Occasional high readings normalize upon rechecking. Advise checking blood pressure 30 minutes to an hour after waking up. Continue current medications and follow-up in  one year.  Chronic Kidney Disease Recent labs show creatinine at 1.67 and eGFR at 40, indicating a slight decline over six months. Discussed monitoring and potential medications such as Farxiga, Jardiance, or [place] if function declines. Contact Dr. Creola Corn to discuss recent kidney function and recheck labs.  Follow-up Coordinate with Dr. Creola Corn regarding kidney function and schedule a cardiology follow-up in one year.           Signed,  Yates Decamp, MD, [facility] 03/30/2023, 6:30 PM [facility] [address] [place] [address] [place], [place] [address] Phone: [phone]. Fax:  [phone]

## 2023-03-30 NOTE — Patient Instructions (Signed)
 Medication Instructions:  Your physician recommends that you continue on your current medications as directed. Please refer to the Current Medication list given to you today.  *If you need a refill on your cardiac medications before your next appointment, please call your pharmacy*   Lab Work: none If you have labs (blood work) drawn today and your tests are completely normal, you will receive your results only by: MyChart Message (if you have MyChart) OR A paper copy in the mail If you have any lab test that is abnormal or we need to change your treatment, we will call you to review the results.   Testing/Procedures: none   Follow-Up: At [facility], you and your health needs are our priority.  As part of our continuing mission to provide you with exceptional heart care, we have created designated Provider Care Teams.  These Care Teams include your primary Cardiologist (physician) and Advanced Practice Providers (APPs -  Physician Assistants and Nurse Practitioners) who all work together to provide you with the care you need, when you need it.  We recommend signing up for the patient portal called "MyChart".  Sign up information is provided on this After Visit Summary.  MyChart is used to connect with patients for Virtual Visits (Telemedicine).  Patients are able to view lab/test results, encounter notes, upcoming appointments, etc.  Non-urgent messages can be sent to your provider as well.   To learn more about what you can do with MyChart, go to [email or web address].    Your next appointment:   12 month(s)  Provider:   Dr Jacinto Halim    Other Instructions

## 2023-04-22 DIAGNOSIS — E785 Hyperlipidemia, unspecified: Secondary | ICD-10-CM | POA: Diagnosis not present

## 2023-04-25 DIAGNOSIS — S61412A Laceration without foreign body of left hand, initial encounter: Secondary | ICD-10-CM | POA: Diagnosis not present

## 2023-04-25 DIAGNOSIS — Z23 Encounter for immunization: Secondary | ICD-10-CM | POA: Diagnosis not present

## 2023-04-25 DIAGNOSIS — Z789 Other specified health status: Secondary | ICD-10-CM | POA: Diagnosis not present

## 2023-05-21 ENCOUNTER — Other Ambulatory Visit: Payer: Self-pay | Admitting: Cardiology

## 2023-05-21 DIAGNOSIS — I6523 Occlusion and stenosis of bilateral carotid arteries: Secondary | ICD-10-CM

## 2023-05-21 DIAGNOSIS — E785 Hyperlipidemia, unspecified: Secondary | ICD-10-CM

## 2023-05-26 DIAGNOSIS — Z85828 Personal history of other malignant neoplasm of skin: Secondary | ICD-10-CM | POA: Diagnosis not present

## 2023-05-26 DIAGNOSIS — L814 Other melanin hyperpigmentation: Secondary | ICD-10-CM | POA: Diagnosis not present

## 2023-05-26 DIAGNOSIS — D692 Other nonthrombocytopenic purpura: Secondary | ICD-10-CM | POA: Diagnosis not present

## 2023-05-26 DIAGNOSIS — D0462 Carcinoma in situ of skin of left upper limb, including shoulder: Secondary | ICD-10-CM | POA: Diagnosis not present

## 2023-05-26 DIAGNOSIS — D0439 Carcinoma in situ of skin of other parts of face: Secondary | ICD-10-CM | POA: Diagnosis not present

## 2023-05-26 DIAGNOSIS — L570 Actinic keratosis: Secondary | ICD-10-CM | POA: Diagnosis not present

## 2023-05-26 DIAGNOSIS — L821 Other seborrheic keratosis: Secondary | ICD-10-CM | POA: Diagnosis not present

## 2023-05-29 ENCOUNTER — Other Ambulatory Visit: Payer: Self-pay | Admitting: Cardiology

## 2023-05-29 DIAGNOSIS — I480 Paroxysmal atrial fibrillation: Secondary | ICD-10-CM

## 2023-06-01 NOTE — Telephone Encounter (Signed)
 Prescription refill request for Xarelto  received.  Indication: a fib Last office visit: 03/30/23 Weight: 190 Age: 83 Scr: 1.67 [facility] 10/24 CrCl: 40 ml/min

## 2023-06-25 DIAGNOSIS — K080 Exfoliation of teeth due to systemic causes: Secondary | ICD-10-CM | POA: Diagnosis not present

## 2023-09-02 ENCOUNTER — Other Ambulatory Visit: Payer: Self-pay | Admitting: Cardiology

## 2023-09-02 DIAGNOSIS — I480 Paroxysmal atrial fibrillation: Secondary | ICD-10-CM

## 2023-09-02 NOTE — Telephone Encounter (Signed)
 Prescription refill request for Xarelto  received.  Indication: Afib  Last office visit: 03/30/23 [place])  Weight: 86.3kg Age: 83 Scr: 1.67 (11/24/22 via [facility])  CrCl: 40.19ml/min  Appropriate dose. Refill sent.

## 2023-11-05 NOTE — Patient Instructions (Addendum)
 Please continue using your CPAP regularly. While your insurance requires that you use CPAP at least 4 hours each night on 70% of the nights, I recommend, that you not skip any nights and use it throughout the night if you can. Getting used to CPAP and staying with the treatment long term does take time and patience and discipline. Untreated obstructive sleep apnea when it is moderate to severe can have an adverse impact on cardiovascular health and raise her risk for heart disease, arrhythmias, hypertension, congestive heart failure, stroke and diabetes. Untreated obstructive sleep apnea causes sleep disruption, nonrestorative sleep, and sleep deprivation. This can have an impact on your day to day functioning and cause daytime sleepiness and impairment of cognitive function, memory loss, mood disturbance, and problems focussing. Using CPAP regularly can improve these symptoms.  We will update supply orders, today. Please let Dr Onita know about the shortness of breath when you start walking. If you have any chest pain, significant dizziness or trouble breathing, please seek emergency medical attention.   Follow up in 1 year

## 2023-11-05 NOTE — Progress Notes (Signed)
 PATIENT: Matthew Cannon DOB: 31-Jan-1941  REASON FOR VISIT: follow up HISTORY FROM: patient  Virtual Visit via MyChart video  I connected with Matthew Cannon on 11/10/23 at  9:30 AM EDT via MyChart video and verified that I am speaking with the correct person using two identifiers.   I discussed the limitations, risks, security and privacy concerns of performing an evaluation and management service by Mychart video and the availability of in person appointments. I also discussed with the patient that there may be a patient responsible charge related to this service. The patient expressed understanding and agreed to proceed.   History of Present Illness:  11/10/23 ALL (MyChart): Matthew Cannon is a 83 y.o. male here today for follow up for OSA on CPAP. He continues to do well on therapy. He is using CPAP nightly for about 7-8 hours, on average. He denies concerns with machine or supplies. He is no longer using the humidity setting as he felt it was causing more congestion. He continues to be active. He walks daily. He has noticed some mild shortness of breath when he first starts walking but reports that within a few minutes, symptoms resolve. No chest pain. He has had occasional dizziness. Hx PAF. On Xarelto .     11/10/2022 ALL:  Matthew Cannon returns for follow up for OSA on CPAP. He is doing well on therapy. He is using CPAP nightly for about 7-8 hours, on average. He denies concerns with machine or supplies.     11/11/2021 ALL: Matthew Cannon returns for follow up for OSA on CPAP. He continues to do well on therapy. He is using CPAP nightly for about 7 hours. He is using a FFM. No concerns with machine or supplies.     03/12/21 ALL: Matthew Cannon is a 83 y.o. male here today for follow up for OSA on CPAP. He recently received a new CPAP machine and returns for initial compliance report. He is doing very well. He is using his new machine nightly for at least 4 hours. He feels he may  be sleeping better than with his old machine.        11/05/2020 ALL: [name] returns for follow up for OSA on CPAP. He continues to do well. Orders for travel machine placed in April 2022 but he did not pursue. He was able to use his home CPAP with travel. He continues to do very well on CPAP. He is sleeping great. He denies concerns with machine or supplies. His machine was set up 06/2015.     11/03/2019 ALL:  Matthew Cannon is a 83 y.o. male here today for follow up for OSA on CPAP.  He is doing very well on CPAP therapy.  He does admit that it is not comfortable, however, he does note improvement in sleep quality when he is using CPAP therapy.  He denies any concerns with CPAP machine or supplies.  Compliance report dated 10/03/2019 through 11/01/2019 reveals that he used CPAP 30 of the past 30 days for compliance of 100%.  He used CPAP greater than 4 hours all 30 days.  Average usage was 7 hours and 21 minutes.  Residual AHI was 2.5 on 5 to 12 cm of water and an EPR of 3.  There was no significant leak noted.  HISTORY: (copied from Dr Dohmeier's note on 11/01/2018)   HPI:  Matthew Cannon is a 83 y.o. male , seen here as a referral from Dr. Ganji for a sleep  evaluation, to rule out in the setting of atrial fibrillation. His AHI in 2017 was 37.6/h . December 2019 he had break through atrial fib. Liver impairment has forced a reduction in xeralto dose.    Rv 11-01-2018- Matthew Cannon reports not feeling fatigued or depressed, using his CPAO compliantly.  He is married, and he and his spouse have limited their social activities, shopping and not going out much. They communicate by face time.  His eldest daughter passed away in 14-Jun-2023 from Breast cancer at age 47.   [name] has been 100% compliant CPAP user with an average user time of 7 hours and 2 minutes.  Minimum pressure is 5 and maximum pressure setting 12 cmH2O with an EPR of 3 cmH2O.  His air leak is very low, his residual  events per hour or 2.1 which is an excellent resolution.  He has mostly obstructive apneas left but also 4 minutes per night and Cheyne-Stokes respirations.  Central apneas seem to be breaking through on the CPAP.  Fatigue severity scale was endorsed at 16 and his Epworth sleepiness score at 7, the geriatric depression score at 1 point out of 15. No changes necessary.    2-30-2017.The patient had undergone a 30 day cardiac monitor , which captured 4 atrial fib attacks, 3 in sleep. He started on 3 medications , which he doesn't tolerate well. He feels sick and clammy on the medications. He has chronic rhinitis and sinusitis.  Matthew Cannon has undergone 2 Mohs surgeries, one on the left neck just below the lower jaw and one on the high forehead. This will be important should CPAP treatment be indicated after our evaluation is completed.   Matthew Cannon medical records speak of a cardiac workup following syncope spells ; there were 3 near syncopal episodes in October - November 2016 the first one occurred while he was still working in the heat outside and he may have been dehydrated or overheated, the second and third Near-syncope occurred inside without any exertion. The symptoms lasted each time between 4 and 5 minutes or even less. He had no chest pain or tightness and no shortness of breath associated Dr. Godfrey workup included an event monitor and echocardiogram and a cardiac stress test he continued to have intermittent episodes of dizziness.   10-29-2017, RV on CPAP.  Matthew Cannon is seen here today is a 83 year old male patient originally referred by Dr. Ladona.  He has for several months not needed any kind of beta-blocker to control his heart rate and had no syncopal episodes or near syncope's.  He also did not receive a pacemaker as his heart rate is now in the low normal range. The patient is a CPAP patient who is using an autotitrator after being diagnosed with obstructive sleep apnea  he has been 100% compliant over the last 90 days and 30 days.  His 30-day download shows an average use at time of 7 hours and 38 minutes at night pressure window is between 5 and 10 cmH2O pressure with 3 cm expiratory relief.  His residual AHI is 2.6 which speaks for an excellent resolution.  He does have only minor air leakage and the 95th percentile pressure is 9.6.  I would actually like to increase his maximum pressure window by 2 cm to 12 cm water.   Sleep habits are as follows:The patient usually goes to bed between 10 and 11 PM, he is currently using one small pillow but used multiple pillows as  a head rest before. He prefers the supine sleep position, the bedroom is cool, quiet and dark. He has nocturia 4-6 times each night- now 3-4 times a night , his sinus blockage makes breathing difficulties and he snores.  He drinks a lot of water. He rises at 7 AM , often already awake after 5 AM. He doesn't wake with or from headaches and he is usually well refreshed. No alarm needed. He feels not restless, only sometimes light headed and that's not the case for month now.    Sleep medical history and family sleep history: 10-29-2017,  Atrial fib, seen by Dr. Kelsie - has not needed a pacemaker he reports, as heart rate has not been lower than 40 bpm, yet he had several syncope episodes. Reduced Toprol  dose.  In the meantime developed a masked face and hoarse voice, no tremor.    Social history: married, retired, 2 sisters, 3 adult children. Non smoker , 3 glasses of ETOH per week. His current medications include Xarelto , lisinopril he is now also on flecainide and metoprolol . The patient has a very remote smoking history of one pack per day until1983. He is a modest alcohol  drinker with 3 drinks per week and does not use caffeine aged beverages.      Observations/Objective:  Generalized: Well developed, in no acute distress  Mentation: Alert oriented to time, place, history taking. Follows all commands  speech and language fluent   Assessment and Plan:  83 y.o. year old male  has a past medical history of Cancer (HCC), Dehydration (07/28/2022), Left atrial dilatation, Sinus bradycardia, Syncope, and Tachycardia-bradycardia syndrome (HCC) (08/09/2018). here with    ICD-10-CM   1. OSA on CPAP  G47.33 For home use only DME continuous positive airway pressure (CPAP)    2. Paroxysmal atrial fibrillation (HCC)  I48.0     3. Shortness of breath  R06.02      Laney is doing very well on CPAP therapy. Compliance report reveals excellent compliance. He was encouraged to continue using CPAP nightly and for greater than 4 hours each night. Healthy lifestyle habits encouraged. He will follow-up closely with primary care. I have asked him to message Dr Onita with current concerns of shortness of breath when he starts walking. Red flag warning signs and when to seek emergency medical attention discussed. He will follow-up with us  in 1 year. He verbalizes understanding and agreement with this plan.    Orders Placed This Encounter  Procedures   For home use only DME continuous positive airway pressure (CPAP)    Heated Humidity with all supplies as needed    Length of Need:   Lifetime    Patient has OSA or probable OSA:   Yes    Is the patient currently using CPAP in the home:   Yes    Settings:   Other see comments    CPAP supplies needed:   Mask, headgear, cushions, filters, heated tubing and water chamber    No orders of the defined types were placed in this encounter.    Follow Up Instructions:  I discussed the assessment and treatment plan with the patient. The patient was provided an opportunity to ask questions and all were answered. The patient agreed with the plan and demonstrated an understanding of the instructions.   The patient was advised to call back or seek an in-person evaluation if the symptoms worsen or if the condition fails to improve as anticipated.  I provided 20 minutes  of face-to-face  and non face-to-face time during this MyChart video encounter. Patient located at their place of residence. Provider is in the office.    Shakeia Krus, NP

## 2023-11-09 ENCOUNTER — Encounter: Payer: Self-pay | Admitting: *Deleted

## 2023-11-09 NOTE — Progress Notes (Unsigned)
 Matthew Cannon

## 2023-11-10 ENCOUNTER — Encounter: Payer: Self-pay | Admitting: Family Medicine

## 2023-11-10 ENCOUNTER — Telehealth: Payer: Medicare Other | Admitting: Family Medicine

## 2023-11-10 DIAGNOSIS — R0602 Shortness of breath: Secondary | ICD-10-CM

## 2023-11-10 DIAGNOSIS — I480 Paroxysmal atrial fibrillation: Secondary | ICD-10-CM | POA: Diagnosis not present

## 2023-11-10 DIAGNOSIS — G4733 Obstructive sleep apnea (adult) (pediatric): Secondary | ICD-10-CM | POA: Diagnosis not present

## 2023-11-10 DIAGNOSIS — Z23 Encounter for immunization: Secondary | ICD-10-CM | POA: Diagnosis not present

## 2023-11-23 DIAGNOSIS — I129 Hypertensive chronic kidney disease with stage 1 through stage 4 chronic kidney disease, or unspecified chronic kidney disease: Secondary | ICD-10-CM | POA: Diagnosis not present

## 2023-11-23 DIAGNOSIS — R42 Dizziness and giddiness: Secondary | ICD-10-CM | POA: Diagnosis not present

## 2023-11-23 DIAGNOSIS — R0609 Other forms of dyspnea: Secondary | ICD-10-CM | POA: Diagnosis not present

## 2023-11-26 ENCOUNTER — Telehealth: Payer: Self-pay | Admitting: Cardiology

## 2023-11-26 NOTE — Telephone Encounter (Signed)
Pt requesting a c/b.

## 2023-11-26 NOTE — Telephone Encounter (Signed)
 Left message for patient to return the call.

## 2023-11-26 NOTE — Telephone Encounter (Signed)
 Pt c/o Shortness Of Breath: STAT if SOB developed within the last 24 hours or pt is noticeably SOB on the phone  1. Are you currently SOB (can you hear that pt is SOB on the phone)?    2. How long have you been experiencing SOB?   3. Are you SOB when sitting or when up moving around?   4. Are you currently experiencing any other symptoms?   From Patient Messages: 1. No  2. 6-8 episodes over last two months  3. No  4. Shortness of breadth can be accompanied by mild light headedness.

## 2023-11-30 DIAGNOSIS — L814 Other melanin hyperpigmentation: Secondary | ICD-10-CM | POA: Diagnosis not present

## 2023-11-30 DIAGNOSIS — L821 Other seborrheic keratosis: Secondary | ICD-10-CM | POA: Diagnosis not present

## 2023-11-30 DIAGNOSIS — Z85828 Personal history of other malignant neoplasm of skin: Secondary | ICD-10-CM | POA: Diagnosis not present

## 2023-11-30 DIAGNOSIS — D1801 Hemangioma of skin and subcutaneous tissue: Secondary | ICD-10-CM | POA: Diagnosis not present

## 2023-11-30 DIAGNOSIS — L570 Actinic keratosis: Secondary | ICD-10-CM | POA: Diagnosis not present

## 2023-12-01 NOTE — Telephone Encounter (Signed)
 Left message to call back.

## 2023-12-02 ENCOUNTER — Telehealth: Payer: Self-pay | Admitting: Cardiology

## 2023-12-02 NOTE — Telephone Encounter (Signed)
 Spoke with pt, see previous phone note. He would like to be seem. Follow up scheduled

## 2023-12-02 NOTE — Telephone Encounter (Signed)
 Patient states he was talking to a nurse but got disconnected. Please advise

## 2023-12-02 NOTE — Telephone Encounter (Signed)
 Spoke with pt, for the last 2-3 months he reports when he starts his walk, the first part is uphill. When he gets to the top of the hill. He will be SOB. He walks through the SOB and is then fine. He denies any chest pain or SOB at other times except bending over. He was seen by dr onita and his ecg and lab work was normal. I was going to make the patient an appointment but he wondered what we would do different than dr onita. I explained we did not have any records from dr onita and if he did not want an appointment, that is fine. He hung up the phone.

## 2023-12-03 NOTE — Progress Notes (Unsigned)
 Cardiology Office Note    Date:  12/04/2023  ID:  Braiden, Rodman 07/15/1940, MRN [id number] PCP:  Onita Rush, MD  Cardiologist:  Gordy Bergamo, MD  Electrophysiologist:  None   Chief Complaint: Shortness of breath  History of Present Illness: Matthew   Matthew Cannon is a 83 y.o. male with visit-pertinent history of paroxysmal atrial fibrillation, hypertension and OSA.  Patient was admitted to [facility] in [place]  in 07/2022 following an episode of syncope felt to be due to dehydration, noted to have normal LVEF.  Echocardiogram on 07/29/2022 indicated LVEF 62%, mild diastolic dysfunction, RV systolic function and size was normal, noted to have mild mitral valve prolapse with mild regurgitation, no evidence of aortic valve regurgitation or stenosis.  Patient was last seen in clinic by Dr. Bergamo on 03/30/2023, he denied any heart racing or feelings of atrial fibrillation, noted his exercise capacity had been affected by cold weather but otherwise remained the same.  Today patient presents regarding increased shortness of breath.  He reports that he has had three episodes of increased shortness of breath in the last six weeks.  Patient reports that he will typically be out walking and have to climb up a hill in his neighborhood to start walking and he has noted significant increased shortness of breath, he reports that he will continue walking with eventual resolution in symptoms, notes this has also occurred.  He denies any chest pain, tightness or pressure.  He notes that he does not always have shortness of breath, notes only 3 episodes.  He reports that he has noted some increased fatigue in recent months, feels he does not have the stamina that he previously did.  He denies any increased lower extremity edema, orthopnea or PND.  He denies any shortness of breath at rest, denies any palpitations or feeling of increased heart rates.  He denies any presyncope or  syncope.  Labwork independently reviewed: 11/23/2023: Hemoglobin 14.5, hematocrit 43.8, creatinine 1.4, sodium 141, potassium 5.3 ROS: .   Today he denies chest pain,lower extremity edema, fatigue, palpitations, melena, hematuria, hemoptysis, diaphoresis, weakness, presyncope, syncope, orthopnea, and PND.  All other systems are reviewed and otherwise negative. Studies Reviewed: Matthew   EKG:  EKG is ordered today, personally reviewed, demonstrating  EKG Interpretation Date/Time:  Friday December 04 2023 13:58:51 EDT Ventricular Rate:  54 PR Interval:  190 QRS Duration:  84 QT Interval:  408 QTC Calculation: 386 R Axis:   8  Text Interpretation: Sinus bradycardia When compared with ECG of 30-Mar-2023 15:58, Nonspecific T wave abnormality now evident in Inferior leads Confirmed by Miangel Flom [phone]) on 12/04/2023 5:56:30 PM   CV Studies: Cardiac studies reviewed are outlined and summarized above. Otherwise please see EMR for full report. Cardiac Studies & Procedures   ______________________________________________________________________________________________        MONITORS  CARDIAC EVENT MONITOR 03/05/2015       ______________________________________________________________________________________________       Current Reported Medications:.    Current Meds  Medication Sig   amLODipine (NORVASC) 2.5 MG tablet Take 2.5 mg by mouth daily.   atorvastatin  (LIPITOR) 10 MG tablet TAKE 1 TABLET(10 MG) BY MOUTH DAILY   olmesartan  (BENICAR ) 40 MG tablet Take 40 mg by mouth daily.   XARELTO  15 MG TABS tablet TAKE 1 TABLET(15 MG) BY MOUTH DAILY WITH SUPPER   Physical Exam:    VS:  BP 112/72   Pulse 64   Ht 5' 10 (1.778 m)  Wt 195 lb 9.6 oz (88.7 kg)   SpO2 95%   BMI 28.07 kg/m    Wt Readings from Last 3 Encounters:  12/04/23 195 lb 9.6 oz (88.7 kg)  03/30/23 190 lb 3.2 oz (86.3 kg)  11/10/22 187 lb 8 oz (85 kg)    GEN: Well nourished, well developed in no acute  distress NECK: No JVD; No carotid bruits CARDIAC: RRR, no murmurs, rubs, gallops RESPIRATORY:  Clear to auscultation without rales, wheezing or rhonchi  ABDOMEN: Soft, non-tender, non-distended EXTREMITIES:  No edema; No acute deformity     Asessement and Plan:.    DOE/bradycardia: Patient presents today regarding 3 episodes of increased shortness of breath in the last 3 weeks.  Patient notes that he has had three episodes in which he will be walking up a hill and have significant shortness of breath, he notes that he does not stop and simply powers through with eventual resolution of breathing.  He denies any chest pain, presyncope or syncope.  He denies any shortness of breath at rest, denies any increased lower extremity edema, orthopnea or PND.  Question of possible component of chronotropic incompetence given history of bradycardia.  Patient lab work completed at PCPs office reassuring.  Patient agreeable to echocardiogram and 2-week live ZIO to assess atrial fibrillation burden as well as bradycardia.  Reviewed ED precautions.  If results unrevealing we will consider ischemic evaluation.  Mitral valve prolapse: Echo in 07/2022 indicated mild mitral valve prolapse with mild central jet regurgitation.  Patient noting increased episodes of dyspnea on exertion as noted above.  Reviewed ED precautions.  Will check echocardiogram as noted above.  Paroxysmal atrial fibrillation: Patient with known history of paroxysmal atrial fibrillation.  He denies any increased palpitations, notes increased shortness of breath on exertion as noted above.  Will check 2-week live ZIO and echocardiogram.  Patient denies any bleeding problems on Xarelto .  Continue Xarelto  15 mg daily.   Disposition: F/u with [name], NP in 6-8 weeks.   Signed, Lanyla Costello D Monti Jilek, NP

## 2023-12-04 ENCOUNTER — Encounter: Payer: Self-pay | Admitting: Cardiology

## 2023-12-04 ENCOUNTER — Ambulatory Visit: Attending: Cardiology | Admitting: Cardiology

## 2023-12-04 ENCOUNTER — Ambulatory Visit

## 2023-12-04 VITALS — BP 112/72 | HR 64 | Ht 70.0 in | Wt 195.6 lb

## 2023-12-04 DIAGNOSIS — R001 Bradycardia, unspecified: Secondary | ICD-10-CM

## 2023-12-04 DIAGNOSIS — R0609 Other forms of dyspnea: Secondary | ICD-10-CM

## 2023-12-04 DIAGNOSIS — I480 Paroxysmal atrial fibrillation: Secondary | ICD-10-CM | POA: Diagnosis not present

## 2023-12-04 DIAGNOSIS — I341 Nonrheumatic mitral (valve) prolapse: Secondary | ICD-10-CM | POA: Diagnosis not present

## 2023-12-04 NOTE — Patient Instructions (Signed)
 Medication Instructions:  Your physician recommends that you continue on your current medications as directed. Please refer to the Current Medication list given to you today.   Labwork: None  Testing/Procedures: Your physician has requested that you have an echocardiogram. Echocardiography is a painless test that uses sound waves to create images of your heart. It provides your doctor with information about the size and shape of your heart and how well your heart's chambers and valves are working. This procedure takes approximately one hour. There are no restrictions for this procedure. Please do NOT wear cologne, perfume, aftershave, or lotions (deodorant is allowed). Please arrive 15 minutes prior to your appointment time.  Please note: We ask at that you not bring children with you during ultrasound (echo/ vascular) testing. Due to room size and safety concerns, children are not allowed in the ultrasound rooms during exams. Our front office staff cannot provide observation of children in our lobby area while testing is being conducted. An adult accompanying a patient to their appointment will only be allowed in the ultrasound room at the discretion of the ultrasound technician under special circumstances. We apologize for any inconvenience.  Your physician has recommended that you wear a Zio monitor.   This monitor is a medical device that records the heart's electrical activity. Doctors most often use these monitors to diagnose arrhythmias. Arrhythmias are problems with the speed or rhythm of the heartbeat. The monitor is a small device applied to your chest. You can wear one while you do your normal daily activities. While wearing this monitor if you have any symptoms to push the button and record what you felt. Once you have worn this monitor for the period of time provider prescribed (for 14 days), you will return the monitor device in the postage paid box. Once it is returned they will download  the data collected and provide us  with a report which the provider will then review and we will call you with those results. Important tips:  Avoid showering during the first 24 hours of wearing the monitor. Avoid excessive sweating to help maximize wear time. Do not submerge the device, no hot tubs, and no swimming pools. Keep any lotions or oils away from the patch. After 24 hours you may shower with the patch on. Take brief showers with your back facing the shower head.  Do not remove patch once it has been placed because that will interrupt data and decrease adhesive wear time. Push the button when you have any symptoms and write down what you were feeling. Once you have completed wearing your monitor, remove and place into box which has postage paid and place in your outgoing mailbox.  If for some reason you have misplaced your box then call our office and we can provide another box and/or mail it off for you.   Follow-Up: Your physician recommends that you schedule a follow-up appointment in: 6-8 weeks  Any Other Special Instructions Will Be Listed Below (If Applicable). Thank you for choosing [facility]!     If you need a refill on your cardiac medications before your next appointment, please call your pharmacy.

## 2023-12-04 NOTE — Progress Notes (Unsigned)
 Applied a 14 day Zio XT monitor to patient in the office  Ganji to read  Originally applied [id number] blinked orange

## 2023-12-07 ENCOUNTER — Telehealth: Payer: Self-pay | Admitting: Cardiology

## 2023-12-07 NOTE — Telephone Encounter (Signed)
 [employer] calling with abnormal EKG results. Please advise.   Reference #: [id number]

## 2023-12-07 NOTE — Telephone Encounter (Signed)
 Spoke with [employer] rep      Cardiac Monitor Alert  Date of alert:  12/07/2023   Patient Name: Matthew Cannon  DOB: 1940/07/13  MRN: [id number]   [facility] Cardiologist: Gordy Bergamo, MD   HeartCare EP:  None    Monitor Information: Long Term Monitor-Live Telemetry [ZioAT]  Reason:  Afib and Bradycardia Ordering provider:  K. West, NP   Alert Atrial Fibrillation/Flutter This is the 1st alert for this rhythm.  The patient has a hx of Atrial Fibrillation/Flutter.  The patient is not currently on anticoagulation.  Anticoagulation medication as of 12/07/2023           XARELTO  15 MG TABS tablet TAKE 1 TABLET(15 MG) BY MOUTH DAILY WITH SUPPER       Next Cardiology Appointment   Date:  01/22/2024  Provider:  K.West,NP  The patient was contacted today.  He is asymptomatic. Arrhythmia, symptoms and history reviewed with K. Devora, NP.  Plan:  Continue monitoring     Aldona FORBES Marina, RN  12/07/2023 9:29 AM

## 2023-12-08 DIAGNOSIS — R001 Bradycardia, unspecified: Secondary | ICD-10-CM | POA: Diagnosis not present

## 2023-12-08 DIAGNOSIS — I480 Paroxysmal atrial fibrillation: Secondary | ICD-10-CM | POA: Diagnosis not present

## 2023-12-08 DIAGNOSIS — R739 Hyperglycemia, unspecified: Secondary | ICD-10-CM | POA: Diagnosis not present

## 2023-12-08 DIAGNOSIS — I129 Hypertensive chronic kidney disease with stage 1 through stage 4 chronic kidney disease, or unspecified chronic kidney disease: Secondary | ICD-10-CM | POA: Diagnosis not present

## 2023-12-08 DIAGNOSIS — E785 Hyperlipidemia, unspecified: Secondary | ICD-10-CM | POA: Diagnosis not present

## 2023-12-15 DIAGNOSIS — N1831 Chronic kidney disease, stage 3a: Secondary | ICD-10-CM | POA: Diagnosis not present

## 2023-12-15 DIAGNOSIS — Z1389 Encounter for screening for other disorder: Secondary | ICD-10-CM | POA: Diagnosis not present

## 2023-12-15 DIAGNOSIS — R82998 Other abnormal findings in urine: Secondary | ICD-10-CM | POA: Diagnosis not present

## 2023-12-15 DIAGNOSIS — I10 Essential (primary) hypertension: Secondary | ICD-10-CM | POA: Diagnosis not present

## 2023-12-15 DIAGNOSIS — Z0000 Encounter for general adult medical examination without abnormal findings: Secondary | ICD-10-CM | POA: Diagnosis not present

## 2023-12-15 DIAGNOSIS — Z1331 Encounter for screening for depression: Secondary | ICD-10-CM | POA: Diagnosis not present

## 2023-12-18 ENCOUNTER — Encounter: Payer: Self-pay | Admitting: Cardiology

## 2023-12-28 ENCOUNTER — Ambulatory Visit (HOSPITAL_COMMUNITY)
Admission: RE | Admit: 2023-12-28 | Discharge: 2023-12-28 | Disposition: A | Discharge status: Home / Self Care | Source: Ambulatory Visit | Attending: Cardiovascular Disease | Admitting: Cardiovascular Disease

## 2023-12-28 ENCOUNTER — Ambulatory Visit: Payer: Self-pay | Admitting: Cardiology

## 2023-12-28 DIAGNOSIS — I341 Nonrheumatic mitral (valve) prolapse: Secondary | ICD-10-CM | POA: Insufficient documentation

## 2023-12-28 LAB — ECHOCARDIOGRAM COMPLETE
Area-P 1/2: 3.4 cm2
S' Lateral: 3.9 cm

## 2023-12-28 NOTE — Progress Notes (Signed)
Patient has been notified directly; all questions, if any, were answered. Patient voiced understanding.   

## 2024-01-03 DIAGNOSIS — R001 Bradycardia, unspecified: Secondary | ICD-10-CM | POA: Diagnosis not present

## 2024-01-03 DIAGNOSIS — I480 Paroxysmal atrial fibrillation: Secondary | ICD-10-CM

## 2024-01-11 NOTE — Telephone Encounter (Signed)
 Patient read message sent via MyChart from Katlyn West, NP 01/08/24

## 2024-01-18 NOTE — Progress Notes (Unsigned)
 Cardiology Office Note    Date:  01/18/2024  ID:  Matthew, Cannon [birth year], MRN [id number] PCP:  Onita Rush, MD  Cardiologist:  Gordy Bergamo, MD  Electrophysiologist:  None   Chief Complaint: ***  History of Present Illness: [name]    Matthew Cannon is a 83 y.o. male with visit-pertinent history of paroxysmal atrial fibrillation, hypertension and OSA.   Patient was admitted to [facility] in [place]  in 07/2022 following an episode of syncope felt to be due to dehydration, noted to have normal LVEF.  Echocardiogram on 07/29/2022 indicated LVEF 62%, mild diastolic dysfunction, RV systolic function and size was normal, noted to have mild mitral valve prolapse with mild regurgitation, no evidence of aortic valve regurgitation or stenosis.   Patient was last seen in clinic by Dr. Bergamo on 03/30/2023, he denied any heart racing or feelings of atrial fibrillation, noted his exercise capacity had been affected by cold weather but otherwise remained the same.  Patient was last in clinic on 12/04/2023 regarding increased shortness of breath.  Patient reported that he had had 3 episodes of increased shortness of breath in the prior 6 weeks.  He reported that he would typically be out walking and have to climb up a hill in his neighborhood and felt significantly increased shortness of breath, reported that he would continue walking with eventual resolution in symptoms.  He denied any chest pain, tightness or pressure.  He denied any constant shortness of breath or shortness of breath at rest.  He had reported some increased fatigue in recent months.  2-week live ZIO monitor was ordered as well as echocardiogram for further evaluation.  Echocardiogram in 12/24/2023 indicated LVEF 55 to 60%, no RWMA, internal cavity size is mildly dilated, mild LVH, diastolic parameters were normal, RV systolic function and size was normal, normal PASP, trivial mitral valve regurgitation, no  evidence of stenosis, aortic valve regurgitation not visualized, no stenosis present.  14-day event monitor showed predominant underlying rhythm was sinus rhythm, minimum heart rate 33 bpm at 5 10 in the morning and maximum 113 bpm at 2 PM with an average of 55 bpm.  There was 1 4 beat run of NSVT, isolated PVCs rare.  3% burden of atrial fibrillation, longest 1 hour and 13 minutes with an average heart rate of 109 bpm, frequent brief episodes of atrial tachycardia, longest 9 beats.  There were occasional PACs with 3% burden.  There were 3 pauses longest lasting 3.7 seconds, all occurred during sleeping hours.  Today he presents for follow-up.  He reports that he   DOE/bradycardia:  Mitral valve prolapse:  PAF:   Labwork independently reviewed:   ROS: .   *** denies chest pain, shortness of breath, lower extremity edema, fatigue, palpitations, melena, hematuria, hemoptysis, diaphoresis, weakness, presyncope, syncope, orthopnea, and PND.  All other systems are reviewed and otherwise negative.  Studies Reviewed: [name]    EKG:  EKG is ordered today, personally reviewed, demonstrating ***     CV Studies: Cardiac studies reviewed are outlined and summarized above. Otherwise please see EMR for full report. Cardiac Studies & Procedures   ______________________________________________________________________________________________     ECHOCARDIOGRAM  ECHOCARDIOGRAM COMPLETE 12/28/2023  Narrative ECHOCARDIOGRAM REPORT    Patient Name:   Matthew Cannon Date of Exam: 12/28/2023 Medical Rec #:  [id number]          Height:       70.0 in Accession #:    [id number]  Weight:       195.6 lb Date of Birth:  1940/09/08         BSA:          2.068 m Patient Age:    83 years           BP:           112/72 mmHg Patient Gender: M                  HR:           45 bpm. Exam Location:  [address]  Procedure: 2D Echo, Cardiac Doppler, Color Doppler and Strain Analysis (Both Spectral and  Color Flow Doppler were utilized during procedure).  Indications:    I34.1 MVP  History:        Patient has no prior history of Echocardiogram examinations. Mitral Valve Prolapse, Arrythmias:Paroxysmal atrial fibrillation and Bradycardia; Signs/Symptoms:Shortness of Breath.  Sonographer:    Elsie Bohr RDCS Referring Phys: [id number] Demarie Uhlig D Akaiya Touchette  IMPRESSIONS   1. Left ventricular ejection fraction, by estimation, is 55 to 60%. The left ventricle has normal function. The left ventricle has no regional wall motion abnormalities. The left ventricular internal cavity size was mildly dilated. There is mild left ventricular hypertrophy. Left ventricular diastolic parameters were normal. The average left ventricular global longitudinal strain is -19.6 %. The global longitudinal strain is normal. 2. Right ventricular systolic function is normal. The right ventricular size is normal. There is normal pulmonary artery systolic pressure. The estimated right ventricular systolic pressure is 30.5 mmHg. 3. The mitral valve is grossly normal. Trivial mitral valve regurgitation. No evidence of mitral stenosis. 4. The aortic valve is tricuspid. Aortic valve regurgitation is not visualized. No aortic stenosis is present. 5. The inferior vena cava is normal in size with greater than 50% respiratory variability, suggesting right atrial pressure of 3 mmHg.  Comparison(s): No prior Echocardiogram.  FINDINGS Left Ventricle: Left ventricular ejection fraction, by estimation, is 55 to 60%. The left ventricle has normal function. The left ventricle has no regional wall motion abnormalities. The average left ventricular global longitudinal strain is -19.6 %. Strain was performed and the global longitudinal strain is normal. The left ventricular internal cavity size was mildly dilated. There is mild left ventricular hypertrophy. Left ventricular diastolic parameters were normal.  Right Ventricle: The right  ventricular size is normal. No increase in right ventricular wall thickness. Right ventricular systolic function is normal. There is normal pulmonary artery systolic pressure. The tricuspid regurgitant velocity is 2.62 m/s, and with an assumed right atrial pressure of 3 mmHg, the estimated right ventricular systolic pressure is 30.5 mmHg.  Left Atrium: Left atrial size was normal in size.  Right Atrium: Right atrial size was normal in size.  Pericardium: There is no evidence of pericardial effusion.  Mitral Valve: The mitral valve is grossly normal. Trivial mitral valve regurgitation. No evidence of mitral valve stenosis.  Tricuspid Valve: The tricuspid valve is normal in structure. Tricuspid valve regurgitation is mild . No evidence of tricuspid stenosis.  Aortic Valve: The aortic valve is tricuspid. Aortic valve regurgitation is not visualized. No aortic stenosis is present.  Pulmonic Valve: The pulmonic valve was normal in structure. Pulmonic valve regurgitation is mild. No evidence of pulmonic stenosis.  Aorta: The aortic root and ascending aorta are structurally normal, with no evidence of dilitation.  Venous: The inferior vena cava is normal in size with greater than 50% respiratory variability, suggesting right atrial pressure of  3 mmHg.  IAS/Shunts: The atrial septum is grossly normal.   LEFT VENTRICLE PLAX 2D LVIDd:         5.50 cm   Diastology LVIDs:         3.90 cm   LV e' medial:    6.85 cm/s LV PW:         1.20 cm   LV E/e' medial:  11.9 LV IVS:        1.10 cm   LV e' lateral:   10.40 cm/s LVOT diam:     2.10 cm   LV E/e' lateral: 7.8 LV SV:         76 LV SV Index:   37        2D Longitudinal Strain LVOT Area:     3.46 cm  2D Strain GLS (A4C):   -19.5 % 2D Strain GLS (A3C):   -17.1 % 2D Strain GLS (A2C):   -22.1 % 2D Strain GLS Avg:     -19.6 %  RIGHT VENTRICLE             IVC RV S prime:     15.10 cm/s  IVC diam: 1.30 cm TAPSE (M-mode): 2.2 cm RVSP:            30.5 mmHg  LEFT ATRIUM             Index        RIGHT ATRIUM           Index LA diam:        5.00 cm 2.42 cm/m   RA Pressure: 3.00 mmHg LA Vol (A2C):   77.6 ml 37.53 ml/m  RA Area:     13.30 cm LA Vol (A4C):   49.1 ml 23.75 ml/m  RA Volume:   29.80 ml  14.41 ml/m LA Biplane Vol: 66.2 ml 32.02 ml/m AORTIC VALVE LVOT Vmax:   87.80 cm/s LVOT Vmean:  57.700 cm/s LVOT VTI:    0.218 m  AORTA Ao Root diam: 3.70 cm Ao Asc diam:  3.50 cm  MITRAL VALVE                TRICUSPID VALVE MV Area (PHT): 3.40 cm     TR Peak grad:   27.5 mmHg MV Decel Time: 223 msec     TR Vmax:        262.00 cm/s MV E velocity: 81.20 cm/s   Estimated RAP:  3.00 mmHg MV A velocity: 102.00 cm/s  RVSP:           30.5 mmHg MV E/A ratio:  0.80 SHUNTS Systemic VTI:  0.22 m Systemic Diam: 2.10 cm  Sunit Tolia Electronically signed by Madonna Large Signature Date/Time: 12/28/2023/2:33:58 PM    Final    MONITORS  CARDIAC EVENT MONITOR 12/04/2023       ______________________________________________________________________________________________       Current Reported Medications:.    Active Medications[1]  Physical Exam:    VS:  There were no vitals taken for this visit.   Wt Readings from Last 3 Encounters:  12/04/23 195 lb 9.6 oz (88.7 kg)  03/30/23 190 lb 3.2 oz (86.3 kg)  11/10/22 187 lb 8 oz (85 kg)    GEN: Well nourished, well developed in no acute distress NECK: No JVD; No carotid bruits CARDIAC: ***RRR, no murmurs, rubs, gallops RESPIRATORY:  Clear to auscultation without rales, wheezing or rhonchi  ABDOMEN: Soft, non-tender, non-distended EXTREMITIES:  No edema; No acute deformity  Asessement and Plan:.     ***     Disposition: F/u with ***  Signed, Shirline Kendle D Dream Harman, NP      [1]  No outpatient medications have been marked as taking for the 01/22/24 encounter (Appointment) with Daelan Gatt D, NP.

## 2024-01-22 ENCOUNTER — Ambulatory Visit: Attending: Cardiology | Admitting: Cardiology

## 2024-01-22 ENCOUNTER — Encounter: Payer: Self-pay | Admitting: Cardiology

## 2024-01-22 VITALS — BP 124/60 | HR 53 | Ht 70.0 in | Wt 191.8 lb

## 2024-01-22 DIAGNOSIS — R0609 Other forms of dyspnea: Secondary | ICD-10-CM

## 2024-01-22 DIAGNOSIS — I480 Paroxysmal atrial fibrillation: Secondary | ICD-10-CM

## 2024-01-22 DIAGNOSIS — R001 Bradycardia, unspecified: Secondary | ICD-10-CM | POA: Diagnosis not present

## 2024-01-22 DIAGNOSIS — I341 Nonrheumatic mitral (valve) prolapse: Secondary | ICD-10-CM | POA: Diagnosis not present

## 2024-01-22 NOTE — Patient Instructions (Signed)
 Thank you for choosing [facility]!     Medication Instructions:  No procedures were ordered during today's visit.   A referral has been placed for you for Electrophysiology.  *If you need a refill on your cardiac medications before your next appointment, please call your pharmacy*   Lab Work: No labs were ordered during today's visit.  If you have labs (blood work) drawn today and your tests are completely normal, you will receive your results only by: MyChart Message (if you have MyChart) OR A paper copy in the mail If you have any lab test that is abnormal or we need to change your treatment, we will call you to review the results.   Testing/Procedures: No procedures were ordered during today's visit.   Your next appointment:   6 month(s)   Provider:   Gordy Bergamo, MD     Follow-Up: At [facility], you and your health needs are our priority.  As part of our continuing mission to provide you with exceptional heart care, we have created designated Provider Care Teams.  These Care Teams include your primary Cardiologist (physician) and Advanced Practice Providers (APPs -  Physician Assistants and Nurse Practitioners) who all work together to provide you with the care you need, when you need it. We recommend signing up for the patient portal called MyChart.  Sign up information is provided on this After Visit Summary.  MyChart is used to connect with patients for Virtual Visits (Telemedicine).  Patients are able to view lab/test results, encounter notes, upcoming appointments, etc.  Non-urgent messages can be sent to your provider as well.   To learn more about what you can do with MyChart, go to [email or web address].
